# Patient Record
Sex: Male | Born: 1945
Health system: Southern US, Community
[De-identification: ages and names within clinical notes are randomized; demographics above are authoritative.]

## PROBLEM LIST (undated history)

## (undated) DIAGNOSIS — R55 Syncope and collapse: Secondary | ICD-10-CM

## (undated) DIAGNOSIS — I119 Hypertensive heart disease without heart failure: Secondary | ICD-10-CM

## (undated) DIAGNOSIS — F329 Major depressive disorder, single episode, unspecified: Secondary | ICD-10-CM

## (undated) DIAGNOSIS — F419 Anxiety disorder, unspecified: Secondary | ICD-10-CM

## (undated) DIAGNOSIS — L309 Dermatitis, unspecified: Secondary | ICD-10-CM

## (undated) DIAGNOSIS — N400 Enlarged prostate without lower urinary tract symptoms: Secondary | ICD-10-CM

## (undated) DIAGNOSIS — E785 Hyperlipidemia, unspecified: Secondary | ICD-10-CM

## (undated) DIAGNOSIS — E119 Type 2 diabetes mellitus without complications: Secondary | ICD-10-CM

## (undated) DIAGNOSIS — F32A Depression, unspecified: Secondary | ICD-10-CM

## (undated) HISTORY — PX: HERNIA REPAIR: SHX51

## (undated) HISTORY — PX: COLON SURGERY: SHX602

## (undated) HISTORY — DX: Hypertensive heart disease without heart failure: I11.9

## (undated) HISTORY — DX: Syncope and collapse: R55

## (undated) HISTORY — DX: Anxiety disorder, unspecified: F41.9

## (undated) HISTORY — DX: Dermatitis, unspecified: L30.9

## (undated) HISTORY — DX: Type 2 diabetes mellitus without complications: E11.9

## (undated) HISTORY — DX: Benign prostatic hyperplasia without lower urinary tract symptoms: N40.0

## (undated) HISTORY — PX: SHOULDER SURGERY: SHX246

## (undated) HISTORY — DX: Hyperlipidemia, unspecified: E78.5

## (undated) HISTORY — DX: Depression, unspecified: F32.A

---
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
## 2014-07-02 DIAGNOSIS — M542 Cervicalgia: Secondary | ICD-10-CM | POA: Insufficient documentation

## 2014-07-02 DIAGNOSIS — Y92009 Unspecified place in unspecified non-institutional (private) residence as the place of occurrence of the external cause: Secondary | ICD-10-CM

## 2014-07-02 DIAGNOSIS — R55 Syncope and collapse: Secondary | ICD-10-CM | POA: Insufficient documentation

## 2014-07-02 DIAGNOSIS — W19XXXA Unspecified fall, initial encounter: Secondary | ICD-10-CM | POA: Insufficient documentation

## 2015-07-23 DIAGNOSIS — M26609 Unspecified temporomandibular joint disorder, unspecified side: Secondary | ICD-10-CM | POA: Diagnosis not present

## 2015-08-19 ENCOUNTER — Ambulatory Visit (INDEPENDENT_AMBULATORY_CARE_PROVIDER_SITE_OTHER): Payer: PPO | Admitting: Sports Medicine

## 2015-08-19 ENCOUNTER — Encounter: Payer: Self-pay | Admitting: Sports Medicine

## 2015-08-19 DIAGNOSIS — F17200 Nicotine dependence, unspecified, uncomplicated: Secondary | ICD-10-CM

## 2015-08-19 DIAGNOSIS — E119 Type 2 diabetes mellitus without complications: Secondary | ICD-10-CM

## 2015-08-19 DIAGNOSIS — M79672 Pain in left foot: Secondary | ICD-10-CM | POA: Diagnosis not present

## 2015-08-19 DIAGNOSIS — Z720 Tobacco use: Secondary | ICD-10-CM | POA: Diagnosis not present

## 2015-08-19 DIAGNOSIS — Q828 Other specified congenital malformations of skin: Secondary | ICD-10-CM | POA: Diagnosis not present

## 2015-08-19 NOTE — Patient Instructions (Signed)
Diabetes and Foot Care Diabetes may cause you to have problems because of poor blood supply (circulation) to your feet and legs. This may cause the skin on your feet to become thinner, break easier, and heal more slowly. Your skin may become dry, and the skin may peel and crack. You may also have nerve damage in your legs and feet causing decreased feeling in them. You may not notice minor injuries to your feet that could lead to infections or more serious problems. Taking care of your feet is one of the most important things you can do for yourself.  HOME CARE INSTRUCTIONS  Wear shoes at all times, even in the house. Do not go barefoot. Bare feet are easily injured.  Check your feet daily for blisters, cuts, and redness. If you cannot see the bottom of your feet, use a mirror or ask someone for help.  Wash your feet with warm water (do not use hot water) and mild soap. Then pat your feet and the areas between your toes until they are completely dry. Do not soak your feet as this can dry your skin.  Apply a moisturizing lotion or petroleum jelly (that does not contain alcohol and is unscented) to the skin on your feet and to dry, brittle toenails. Do not apply lotion between your toes.  Trim your toenails straight across. Do not dig under them or around the cuticle. File the edges of your nails with an emery board or nail file.  Do not cut corns or calluses or try to remove them with medicine.  Wear clean socks or stockings every day. Make sure they are not too tight. Do not wear knee-high stockings since they may decrease blood flow to your legs.  Wear shoes that fit properly and have enough cushioning. To break in new shoes, wear them for just a few hours a day. This prevents you from injuring your feet. Always look in your shoes before you put them on to be sure there are no objects inside.  Do not cross your legs. This may decrease the blood flow to your feet.  If you find a minor scrape,  cut, or break in the skin on your feet, keep it and the skin around it clean and dry. These areas may be cleansed with mild soap and water. Do not cleanse the area with peroxide, alcohol, or iodine.  When you remove an adhesive bandage, be sure not to damage the skin around it.  If you have a wound, look at it several times a day to make sure it is healing.  Do not use heating pads or hot water bottles. They may burn your skin. If you have lost feeling in your feet or legs, you may not know it is happening until it is too late.  Make sure your health care provider performs a complete foot exam at least annually or more often if you have foot problems. Report any cuts, sores, or bruises to your health care provider immediately. SEEK MEDICAL CARE IF:   You have an injury that is not healing.  You have cuts or breaks in the skin.  You have an ingrown nail.  You notice redness on your legs or feet.  You feel burning or tingling in your legs or feet.  You have pain or cramps in your legs and feet.  Your legs or feet are numb.  Your feet always feel cold. SEEK IMMEDIATE MEDICAL CARE IF:   There is increasing redness,   swelling, or pain in or around a wound.  There is a red line that goes up your leg.  Pus is coming from a wound.  You develop a fever or as directed by your health care provider.  You notice a bad smell coming from an ulcer or wound.   This information is not intended to replace advice given to you by your health care provider. Make sure you discuss any questions you have with your health care provider.   Document Released: 05/19/2000 Document Revised: 01/22/2013 Document Reviewed: 10/29/2012 Elsevier Interactive Patient Education 2016 Elsevier Inc.  

## 2015-08-19 NOTE — Progress Notes (Signed)
Patient ID: Dominic Barber, male   DOB: [birth year], 70 y.o.   MRN: [id number] Subjective: Dominic Barber is a 70 y.o. diabetic male patient who presents to office for evaluation of Left foot pain. Patient complains of pain at the lesion present on Left foot at the ball that has been slowly getting thicker over the last year. Patient states that he was given orthotics from [place] which helped a little bit. However, when the skin builds up very thick nothing helps; pain with direct pressure. Denies stepping on anything any trauma or injury. Patient denies any other pedal complaints.   FBS 180 today   Patient Active Problem List   Diagnosis Date Noted  . Cervical pain 07/02/2014  . Fall in home 07/02/2014  . Syncope and collapse 07/02/2014    No current outpatient prescriptions on file prior to visit.   No current facility-administered medications on file prior to visit.    Allergies  Allergen Reactions  . Meperidine Nausea And Vomiting    Objective:  General: Alert and oriented x3 in no acute distress  Dermatology: Keratotic lesion present sub met 1 on left with central nucleated core noted, no webspace macerations, no ecchymosis bilateral, all nails x 10 are well manicured.  Vascular: Dorsalis Pedis and Posterior Tibial pedal pulses 2/4, Capillary Fill Time 3 seconds, + pedal hair growth bilateral, no edema bilateral lower extremities, Temperature gradient within normal limits.  Neurology: Gross sensation intact via light touch bilateral. Protective sensation intact with Semmes Weinstein monofilament. Vibratory intact with tuning fork bilateral.   Musculoskeletal: Moderate tenderness with palpation at the lesion site on Left, Muscular strength 5/5 in all groups without pain or limitation on range of motion. Pes cavus foot type bilateral.  Assessment and Plan: Problem List Items Addressed This Visit    None    Visit Diagnoses    Porokeratosis    -  Primary    Left foot pain         Diabetes mellitus without complication (HCC)        Relevant Medications    glipiZIDE (GLUCOTROL) 10 MG tablet    lisinopril (PRINIVIL,ZESTRIL) 40 MG tablet    metFORMIN (GLUCOPHAGE) 1000 MG tablet    atorvastatin (LIPITOR) 40 MG tablet    Current smoker           -Complete examination performed -Discussed treatment options -Parred keratoic lesion using a chisel blade; treated the area with Salinocaine and a offloading pad. Instructed patient on use -Recommend to continue with good supportive diabetic shoes and inserts -Recommend daily skin emollients to prevent callus skin from building up -Encourage daily Inspection of feet in the setting of diabetes and continued care -Patient to return to office as needed or [facility] or sooner if condition worsens.  Dominic Barber, DPM

## 2015-08-20 DIAGNOSIS — N400 Enlarged prostate without lower urinary tract symptoms: Secondary | ICD-10-CM | POA: Diagnosis not present

## 2015-08-20 DIAGNOSIS — R350 Frequency of micturition: Secondary | ICD-10-CM | POA: Diagnosis not present

## 2015-08-20 DIAGNOSIS — E119 Type 2 diabetes mellitus without complications: Secondary | ICD-10-CM | POA: Diagnosis not present

## 2015-09-23 DIAGNOSIS — J209 Acute bronchitis, unspecified: Secondary | ICD-10-CM | POA: Diagnosis not present

## 2015-10-11 DIAGNOSIS — E119 Type 2 diabetes mellitus without complications: Secondary | ICD-10-CM | POA: Diagnosis not present

## 2015-10-11 DIAGNOSIS — F330 Major depressive disorder, recurrent, mild: Secondary | ICD-10-CM | POA: Diagnosis not present

## 2015-10-11 DIAGNOSIS — Z0000 Encounter for general adult medical examination without abnormal findings: Secondary | ICD-10-CM | POA: Diagnosis not present

## 2015-10-11 DIAGNOSIS — I10 Essential (primary) hypertension: Secondary | ICD-10-CM | POA: Diagnosis not present

## 2015-10-11 DIAGNOSIS — E7800 Pure hypercholesterolemia, unspecified: Secondary | ICD-10-CM | POA: Diagnosis not present

## 2015-10-11 DIAGNOSIS — Z1389 Encounter for screening for other disorder: Secondary | ICD-10-CM | POA: Diagnosis not present

## 2015-10-11 DIAGNOSIS — N400 Enlarged prostate without lower urinary tract symptoms: Secondary | ICD-10-CM | POA: Diagnosis not present

## 2015-10-11 DIAGNOSIS — E559 Vitamin D deficiency, unspecified: Secondary | ICD-10-CM | POA: Diagnosis not present

## 2016-01-12 DIAGNOSIS — R350 Frequency of micturition: Secondary | ICD-10-CM | POA: Diagnosis not present

## 2016-01-12 DIAGNOSIS — Z794 Long term (current) use of insulin: Secondary | ICD-10-CM | POA: Diagnosis not present

## 2016-01-12 DIAGNOSIS — E119 Type 2 diabetes mellitus without complications: Secondary | ICD-10-CM | POA: Diagnosis not present

## 2016-04-11 DIAGNOSIS — L301 Dyshidrosis [pompholyx]: Secondary | ICD-10-CM | POA: Diagnosis not present

## 2016-04-11 DIAGNOSIS — B353 Tinea pedis: Secondary | ICD-10-CM | POA: Diagnosis not present

## 2016-04-14 DIAGNOSIS — E119 Type 2 diabetes mellitus without complications: Secondary | ICD-10-CM | POA: Diagnosis not present

## 2016-04-14 DIAGNOSIS — E7800 Pure hypercholesterolemia, unspecified: Secondary | ICD-10-CM | POA: Diagnosis not present

## 2016-04-14 DIAGNOSIS — I10 Essential (primary) hypertension: Secondary | ICD-10-CM | POA: Diagnosis not present

## 2016-07-19 DIAGNOSIS — E119 Type 2 diabetes mellitus without complications: Secondary | ICD-10-CM | POA: Diagnosis not present

## 2016-07-19 DIAGNOSIS — Z720 Tobacco use: Secondary | ICD-10-CM | POA: Diagnosis not present

## 2016-07-19 DIAGNOSIS — Z87891 Personal history of nicotine dependence: Secondary | ICD-10-CM | POA: Diagnosis not present

## 2016-07-19 DIAGNOSIS — E7800 Pure hypercholesterolemia, unspecified: Secondary | ICD-10-CM | POA: Diagnosis not present

## 2016-07-19 DIAGNOSIS — I10 Essential (primary) hypertension: Secondary | ICD-10-CM | POA: Diagnosis not present

## 2016-12-22 DIAGNOSIS — R42 Dizziness and giddiness: Secondary | ICD-10-CM | POA: Diagnosis not present

## 2016-12-22 DIAGNOSIS — E119 Type 2 diabetes mellitus without complications: Secondary | ICD-10-CM | POA: Diagnosis not present

## 2016-12-22 DIAGNOSIS — I10 Essential (primary) hypertension: Secondary | ICD-10-CM | POA: Diagnosis not present

## 2017-02-06 DIAGNOSIS — E162 Hypoglycemia, unspecified: Secondary | ICD-10-CM | POA: Diagnosis not present

## 2017-02-06 DIAGNOSIS — H6120 Impacted cerumen, unspecified ear: Secondary | ICD-10-CM | POA: Diagnosis not present

## 2017-02-06 DIAGNOSIS — R42 Dizziness and giddiness: Secondary | ICD-10-CM | POA: Diagnosis not present

## 2017-03-01 DIAGNOSIS — Z23 Encounter for immunization: Secondary | ICD-10-CM | POA: Diagnosis not present

## 2017-03-26 DIAGNOSIS — Z87891 Personal history of nicotine dependence: Secondary | ICD-10-CM | POA: Diagnosis not present

## 2017-03-26 DIAGNOSIS — E7800 Pure hypercholesterolemia, unspecified: Secondary | ICD-10-CM | POA: Diagnosis not present

## 2017-03-26 DIAGNOSIS — Z1389 Encounter for screening for other disorder: Secondary | ICD-10-CM | POA: Diagnosis not present

## 2017-03-26 DIAGNOSIS — E119 Type 2 diabetes mellitus without complications: Secondary | ICD-10-CM | POA: Diagnosis not present

## 2017-03-26 DIAGNOSIS — Z0000 Encounter for general adult medical examination without abnormal findings: Secondary | ICD-10-CM | POA: Diagnosis not present

## 2017-03-26 DIAGNOSIS — I10 Essential (primary) hypertension: Secondary | ICD-10-CM | POA: Diagnosis not present

## 2017-03-26 DIAGNOSIS — Z720 Tobacco use: Secondary | ICD-10-CM | POA: Diagnosis not present

## 2017-03-26 DIAGNOSIS — N400 Enlarged prostate without lower urinary tract symptoms: Secondary | ICD-10-CM | POA: Diagnosis not present

## 2017-06-08 DIAGNOSIS — E119 Type 2 diabetes mellitus without complications: Secondary | ICD-10-CM | POA: Diagnosis not present

## 2017-06-22 DIAGNOSIS — R351 Nocturia: Secondary | ICD-10-CM | POA: Diagnosis not present

## 2017-06-22 DIAGNOSIS — N3000 Acute cystitis without hematuria: Secondary | ICD-10-CM | POA: Diagnosis not present

## 2017-06-22 DIAGNOSIS — N318 Other neuromuscular dysfunction of bladder: Secondary | ICD-10-CM | POA: Diagnosis not present

## 2017-06-22 DIAGNOSIS — N401 Enlarged prostate with lower urinary tract symptoms: Secondary | ICD-10-CM | POA: Diagnosis not present

## 2017-06-28 DIAGNOSIS — N401 Enlarged prostate with lower urinary tract symptoms: Secondary | ICD-10-CM | POA: Diagnosis not present

## 2017-06-28 DIAGNOSIS — R351 Nocturia: Secondary | ICD-10-CM | POA: Diagnosis not present

## 2017-06-28 DIAGNOSIS — N3090 Cystitis, unspecified without hematuria: Secondary | ICD-10-CM | POA: Diagnosis not present

## 2017-06-29 DIAGNOSIS — N401 Enlarged prostate with lower urinary tract symptoms: Secondary | ICD-10-CM | POA: Diagnosis not present

## 2017-06-29 DIAGNOSIS — N318 Other neuromuscular dysfunction of bladder: Secondary | ICD-10-CM | POA: Diagnosis not present

## 2017-07-02 DIAGNOSIS — N401 Enlarged prostate with lower urinary tract symptoms: Secondary | ICD-10-CM | POA: Diagnosis not present

## 2017-07-02 DIAGNOSIS — N3020 Other chronic cystitis without hematuria: Secondary | ICD-10-CM | POA: Diagnosis not present

## 2017-07-02 DIAGNOSIS — R338 Other retention of urine: Secondary | ICD-10-CM | POA: Diagnosis not present

## 2017-07-10 DIAGNOSIS — N3020 Other chronic cystitis without hematuria: Secondary | ICD-10-CM | POA: Diagnosis not present

## 2017-07-10 DIAGNOSIS — N401 Enlarged prostate with lower urinary tract symptoms: Secondary | ICD-10-CM | POA: Diagnosis not present

## 2017-07-10 DIAGNOSIS — N318 Other neuromuscular dysfunction of bladder: Secondary | ICD-10-CM | POA: Diagnosis not present

## 2017-08-07 DIAGNOSIS — N3020 Other chronic cystitis without hematuria: Secondary | ICD-10-CM | POA: Diagnosis not present

## 2017-08-07 DIAGNOSIS — N318 Other neuromuscular dysfunction of bladder: Secondary | ICD-10-CM | POA: Diagnosis not present

## 2017-08-07 DIAGNOSIS — N401 Enlarged prostate with lower urinary tract symptoms: Secondary | ICD-10-CM | POA: Diagnosis not present

## 2017-09-10 DIAGNOSIS — E119 Type 2 diabetes mellitus without complications: Secondary | ICD-10-CM | POA: Diagnosis not present

## 2018-03-20 DIAGNOSIS — Z23 Encounter for immunization: Secondary | ICD-10-CM | POA: Diagnosis not present

## 2018-07-08 DIAGNOSIS — H6123 Impacted cerumen, bilateral: Secondary | ICD-10-CM | POA: Diagnosis not present

## 2018-07-17 DIAGNOSIS — K219 Gastro-esophageal reflux disease without esophagitis: Secondary | ICD-10-CM | POA: Diagnosis not present

## 2018-07-17 DIAGNOSIS — R112 Nausea with vomiting, unspecified: Secondary | ICD-10-CM | POA: Diagnosis not present

## 2018-07-29 DIAGNOSIS — R112 Nausea with vomiting, unspecified: Secondary | ICD-10-CM | POA: Diagnosis not present

## 2018-07-29 DIAGNOSIS — R05 Cough: Secondary | ICD-10-CM | POA: Diagnosis not present

## 2018-08-02 DIAGNOSIS — K5900 Constipation, unspecified: Secondary | ICD-10-CM | POA: Diagnosis not present

## 2018-08-02 DIAGNOSIS — N3289 Other specified disorders of bladder: Secondary | ICD-10-CM | POA: Diagnosis not present

## 2018-08-02 DIAGNOSIS — E119 Type 2 diabetes mellitus without complications: Secondary | ICD-10-CM | POA: Diagnosis not present

## 2018-08-02 DIAGNOSIS — J439 Emphysema, unspecified: Secondary | ICD-10-CM | POA: Diagnosis not present

## 2018-08-02 DIAGNOSIS — F17200 Nicotine dependence, unspecified, uncomplicated: Secondary | ICD-10-CM | POA: Diagnosis not present

## 2018-08-02 DIAGNOSIS — K8020 Calculus of gallbladder without cholecystitis without obstruction: Secondary | ICD-10-CM | POA: Diagnosis not present

## 2018-08-02 DIAGNOSIS — E860 Dehydration: Secondary | ICD-10-CM | POA: Diagnosis not present

## 2018-08-05 DIAGNOSIS — R339 Retention of urine, unspecified: Secondary | ICD-10-CM | POA: Diagnosis not present

## 2018-08-05 DIAGNOSIS — N3000 Acute cystitis without hematuria: Secondary | ICD-10-CM | POA: Diagnosis not present

## 2018-08-05 DIAGNOSIS — E871 Hypo-osmolality and hyponatremia: Secondary | ICD-10-CM | POA: Diagnosis not present

## 2018-08-05 DIAGNOSIS — R338 Other retention of urine: Secondary | ICD-10-CM | POA: Diagnosis not present

## 2018-08-05 DIAGNOSIS — N3090 Cystitis, unspecified without hematuria: Secondary | ICD-10-CM | POA: Diagnosis not present

## 2018-08-05 DIAGNOSIS — I719 Aortic aneurysm of unspecified site, without rupture: Secondary | ICD-10-CM | POA: Diagnosis not present

## 2018-08-06 DIAGNOSIS — R338 Other retention of urine: Secondary | ICD-10-CM | POA: Diagnosis not present

## 2018-08-06 DIAGNOSIS — N401 Enlarged prostate with lower urinary tract symptoms: Secondary | ICD-10-CM | POA: Diagnosis not present

## 2018-08-06 DIAGNOSIS — N3000 Acute cystitis without hematuria: Secondary | ICD-10-CM | POA: Diagnosis not present

## 2018-08-07 DIAGNOSIS — I714 Abdominal aortic aneurysm, without rupture: Secondary | ICD-10-CM | POA: Diagnosis not present

## 2018-08-07 DIAGNOSIS — N179 Acute kidney failure, unspecified: Secondary | ICD-10-CM | POA: Diagnosis not present

## 2018-08-07 DIAGNOSIS — R339 Retention of urine, unspecified: Secondary | ICD-10-CM | POA: Diagnosis not present

## 2018-08-13 DIAGNOSIS — R338 Other retention of urine: Secondary | ICD-10-CM | POA: Diagnosis not present

## 2018-08-13 DIAGNOSIS — N401 Enlarged prostate with lower urinary tract symptoms: Secondary | ICD-10-CM | POA: Diagnosis not present

## 2018-08-13 DIAGNOSIS — N3000 Acute cystitis without hematuria: Secondary | ICD-10-CM | POA: Diagnosis not present

## 2018-08-14 DIAGNOSIS — E871 Hypo-osmolality and hyponatremia: Secondary | ICD-10-CM | POA: Diagnosis not present

## 2018-08-23 DIAGNOSIS — N401 Enlarged prostate with lower urinary tract symptoms: Secondary | ICD-10-CM | POA: Diagnosis not present

## 2018-08-23 DIAGNOSIS — N318 Other neuromuscular dysfunction of bladder: Secondary | ICD-10-CM | POA: Diagnosis not present

## 2018-08-23 DIAGNOSIS — N3020 Other chronic cystitis without hematuria: Secondary | ICD-10-CM | POA: Diagnosis not present

## 2018-08-29 DIAGNOSIS — R1310 Dysphagia, unspecified: Secondary | ICD-10-CM | POA: Diagnosis not present

## 2018-09-06 DIAGNOSIS — N3020 Other chronic cystitis without hematuria: Secondary | ICD-10-CM | POA: Diagnosis not present

## 2018-09-06 DIAGNOSIS — R8290 Unspecified abnormal findings in urine: Secondary | ICD-10-CM | POA: Diagnosis not present

## 2018-09-06 DIAGNOSIS — R29810 Facial weakness: Secondary | ICD-10-CM | POA: Diagnosis not present

## 2018-09-06 DIAGNOSIS — Z79899 Other long term (current) drug therapy: Secondary | ICD-10-CM | POA: Diagnosis not present

## 2018-09-06 DIAGNOSIS — R338 Other retention of urine: Secondary | ICD-10-CM | POA: Diagnosis not present

## 2018-09-06 DIAGNOSIS — N401 Enlarged prostate with lower urinary tract symptoms: Secondary | ICD-10-CM | POA: Diagnosis not present

## 2018-10-03 DIAGNOSIS — R351 Nocturia: Secondary | ICD-10-CM | POA: Diagnosis not present

## 2018-10-03 DIAGNOSIS — N401 Enlarged prostate with lower urinary tract symptoms: Secondary | ICD-10-CM | POA: Diagnosis not present

## 2018-10-03 DIAGNOSIS — N3020 Other chronic cystitis without hematuria: Secondary | ICD-10-CM | POA: Diagnosis not present

## 2018-10-03 DIAGNOSIS — R338 Other retention of urine: Secondary | ICD-10-CM | POA: Diagnosis not present

## 2018-12-27 DIAGNOSIS — E7800 Pure hypercholesterolemia, unspecified: Secondary | ICD-10-CM | POA: Diagnosis not present

## 2018-12-27 DIAGNOSIS — E119 Type 2 diabetes mellitus without complications: Secondary | ICD-10-CM | POA: Diagnosis not present

## 2018-12-27 DIAGNOSIS — I10 Essential (primary) hypertension: Secondary | ICD-10-CM | POA: Diagnosis not present

## 2019-01-06 ENCOUNTER — Other Ambulatory Visit: Payer: Self-pay

## 2019-02-12 DIAGNOSIS — Z23 Encounter for immunization: Secondary | ICD-10-CM | POA: Diagnosis not present

## 2019-03-26 DIAGNOSIS — E119 Type 2 diabetes mellitus without complications: Secondary | ICD-10-CM | POA: Diagnosis not present

## 2019-03-26 DIAGNOSIS — J181 Lobar pneumonia, unspecified organism: Secondary | ICD-10-CM | POA: Diagnosis not present

## 2019-03-26 DIAGNOSIS — I110 Hypertensive heart disease with heart failure: Secondary | ICD-10-CM | POA: Diagnosis not present

## 2019-03-26 DIAGNOSIS — R0602 Shortness of breath: Secondary | ICD-10-CM | POA: Diagnosis not present

## 2019-03-26 DIAGNOSIS — R05 Cough: Secondary | ICD-10-CM | POA: Diagnosis not present

## 2019-03-26 DIAGNOSIS — I509 Heart failure, unspecified: Secondary | ICD-10-CM | POA: Diagnosis not present

## 2019-03-26 DIAGNOSIS — F17210 Nicotine dependence, cigarettes, uncomplicated: Secondary | ICD-10-CM | POA: Diagnosis not present

## 2019-03-26 DIAGNOSIS — R079 Chest pain, unspecified: Secondary | ICD-10-CM | POA: Diagnosis not present

## 2019-03-26 DIAGNOSIS — J189 Pneumonia, unspecified organism: Secondary | ICD-10-CM | POA: Diagnosis not present

## 2019-03-26 DIAGNOSIS — R0600 Dyspnea, unspecified: Secondary | ICD-10-CM | POA: Diagnosis not present

## 2019-03-26 DIAGNOSIS — E871 Hypo-osmolality and hyponatremia: Secondary | ICD-10-CM | POA: Diagnosis not present

## 2019-03-26 DIAGNOSIS — I161 Hypertensive emergency: Secondary | ICD-10-CM | POA: Diagnosis not present

## 2019-03-31 DIAGNOSIS — I509 Heart failure, unspecified: Secondary | ICD-10-CM | POA: Diagnosis not present

## 2019-04-01 ENCOUNTER — Other Ambulatory Visit (HOSPITAL_BASED_OUTPATIENT_CLINIC_OR_DEPARTMENT_OTHER): Payer: Self-pay | Admitting: Internal Medicine

## 2019-04-01 ENCOUNTER — Other Ambulatory Visit (HOSPITAL_COMMUNITY): Payer: Self-pay | Admitting: Internal Medicine

## 2019-04-01 DIAGNOSIS — R0602 Shortness of breath: Secondary | ICD-10-CM

## 2019-04-03 ENCOUNTER — Other Ambulatory Visit: Payer: Self-pay

## 2019-04-03 ENCOUNTER — Ambulatory Visit (HOSPITAL_BASED_OUTPATIENT_CLINIC_OR_DEPARTMENT_OTHER)
Admission: RE | Admit: 2019-04-03 | Discharge: 2019-04-03 | Disposition: A | Discharge status: Home / Self Care | Payer: PPO | Source: Ambulatory Visit | Attending: Internal Medicine | Admitting: Internal Medicine

## 2019-04-03 DIAGNOSIS — J449 Chronic obstructive pulmonary disease, unspecified: Secondary | ICD-10-CM | POA: Insufficient documentation

## 2019-04-03 DIAGNOSIS — F17200 Nicotine dependence, unspecified, uncomplicated: Secondary | ICD-10-CM | POA: Insufficient documentation

## 2019-04-03 DIAGNOSIS — R0602 Shortness of breath: Secondary | ICD-10-CM

## 2019-04-03 NOTE — Progress Notes (Signed)
  Echocardiogram 2D Echocardiogram has been performed.  Dominic Barber 04/03/2019, 9:33 AM

## 2019-04-06 ENCOUNTER — Encounter: Payer: Self-pay | Admitting: Cardiology

## 2019-04-06 NOTE — Progress Notes (Signed)
Cardiology Office Note:    Date:  04/08/2019   ID:  [name], DOB 03/10/1946, MRN [id number]  PCP:  Dominic Roger, MD  Cardiologist:  Shirlee More, MD   Referring MD: Dominic Roger, MD  ASSESSMENT:    1. Chronic systolic heart failure ([place])   2. Hypertensive heart disease with heart failure ([place])   3. Hyperlipidemia, unspecified hyperlipidemia type   4. Type 2 diabetes mellitus without complication, unspecified whether long term insulin use (HCC)   5. Chest pain, unspecified type   6. Shortness of breath on exertion   7. Fatigue, unspecified type   8. Dizziness    PLAN:    In order of problems listed above:  1. He continues to be decompensated class II but no fluid overload due to heart failure continue current treatment and initiate cautiously a small amount of selective beta-blocker.  Await visit in 1 week and decision regarding diagnostic left and right heart catheterization 2. Stable BP at target continue ACE inhibitor loop diuretic 3. Continue his high intensity statin 4. Stable continue current treatment long-term he benefit from SGLT2 agent and I will discuss with his primary care physician  Next appointment 1 week   Medication Adjustments/Labs and Tests Ordered: Current medicines are reviewed at length with the patient today.  Concerns regarding medicines are outlined above.  Orders Placed This Encounter  Procedures  . Basic Metabolic Panel (BMET)  . Pro b natriuretic peptide (BNP)  . Troponin I  . CBC  . Protime-INR   Meds ordered this encounter  Medications  . metoprolol succinate (TOPROL-XL) 25 MG 24 hr tablet    Sig: Take 1 tablet (25 mg total) by mouth daily.    Dispense:  30 tablet    Refill:  1     Chief Complaint  Patient presents with  . Congestive Heart Failure     and low EF 35-40%    History of Present Illness:    Dominic Barber is a 73 y.o. male who is being seen today for the evaluation of heart failure at the request of Dominic Roger, MD.  Prior to the visit 04/03/2019 underwent echocardiogram showing ejection fraction 35 to 40% left ventricle with normal right ventricular function.  He had no significant valvular abnormality.  He has been treated for heart failure by his primary care physician prior to referral was described as having anasarca. Review of records in care everywhere she has a background history of hypertension hyperlipidemia type 2 diabetes previous syncope anxiety depression prostatic enlargement bilateral inguinal hernia repair rotator cuff surgery and colon surgery. EKG independently reviewed from his PCP office 04/07/2019 shows sinus rhythm first-degree AV block.  There is ST-T abnormality suggestive of anterior septal infarction age indeterminate and ischemic precordial T wave inversion.  Laboratory test were performed 03/31/2019 showing a creatinine of 1.10 GFR 65 cc/min serum sodium diminished at 125 potassium 4.6.  He relates that 3 weeks ago he became ill and describes an abrupt rapid onset.  He developed severe shortness of breath with any activities even ADLs orthopnea PND and marked edema.  He was started on a loop diuretic as an outpatient has lost 30 pounds orthopnea and PND have resolved he has no edema but still short of breath with more than indoor activities and feels profoundly weak.  Also had hyponatremia at baseline.  His EKG suggest ischemia or recent infarction and echocardiogram shows severe left ventricular dysfunction.  He is seen by me in the  office today and he has had no chest pain or syncope.  He has no known history of heart disease but tells me he had rheumatic fever as a child and does not think he has had a previous EKG performed to his memory.  Likely his heart failure is improved but still New [place] Heart Association class II.  To optimize treatment I started a low-dose of a selective beta-blocker as I suspect he has significant underlying COPD.  I advised him to undergo  diagnostic left and right heart catheterization concerned with diabetes he has unrecognized CAD and may require revascularization for survival benefit.  Initially said yes then he told me like to go home and think can you come back to my office in a week I will draw labs including troponin proBNP and routine precath orders and see him back in my office 1 week or sooner.  I think he is tenuously compensated and I did not decide today to transition him from lisinopril to [facility].  I would like to see his renal function before adding MRA would defer until after coronary angiography of a contrast load.  Past Medical History:  Diagnosis Date  . Anxiety   . BPH (benign prostatic hyperplasia)   . Depression   . Eczema   . Hyperlipidemia   . Hypertensive heart disease   . Syncope   . Type 2 diabetes mellitus (HCC)     Past Surgical History:  Procedure Laterality Date  . COLON SURGERY    . HERNIA REPAIR Bilateral   . SHOULDER SURGERY      Current Medications: Current Meds  Medication Sig  . ALPRAZolam (XANAX) 0.5 MG tablet Take 0.5 mg by mouth daily as needed.   . ARIPiprazole (ABILIFY) 20 MG tablet Take 20 mg by mouth daily.   Marland Kitchen atorvastatin (LIPITOR) 40 MG tablet Take 40 mg by mouth daily.   . furosemide (LASIX) 20 MG tablet Take 20 mg by mouth daily.  Marland Kitchen glipiZIDE (GLUCOTROL) 10 MG tablet Take 10 mg by mouth 2 (two) times daily before a meal.   . HYDROcodone-acetaminophen (NORCO/VICODIN) 5-325 MG tablet Take 1 tablet by mouth every 4 (four) hours as needed.  Marland Kitchen lisinopril (PRINIVIL,ZESTRIL) 40 MG tablet Take 40 mg by mouth daily.   . metFORMIN (GLUCOPHAGE) 1000 MG tablet Take 1,000 mg by mouth 2 (two) times daily.   . pantoprazole (PROTONIX) 40 MG tablet Take 40 mg by mouth daily.  . tamsulosin (FLOMAX) 0.4 MG CAPS capsule Take 0.4 mg by mouth daily.   Marland Kitchen venlafaxine XR (EFFEXOR XR) 75 MG 24 hr capsule Take 75 mg by mouth daily.      Allergies:   Meperidine   Social History    Socioeconomic History  . Marital status: Married    Spouse name: Not on file  . Number of children: Not on file  . Years of education: Not on file  . Highest education level: Not on file  Occupational History  . Not on file  Social Needs  . Financial resource strain: Not on file  . Food insecurity    Worry: Not on file    Inability: Not on file  . Transportation needs    Medical: Not on file    Non-medical: Not on file  Tobacco Use  . Smoking status: Current Every Day Smoker    Packs/day: 1.00    Years: 50.00    Pack years: 50.00    Types: Cigarettes  . Smokeless tobacco: Never Used  Substance and Sexual Activity  . Alcohol use: Never    Alcohol/week: 0.0 standard drinks    Frequency: Never  . Drug use: Never  . Sexual activity: Not on file  Lifestyle  . Physical activity    Days per week: Not on file    Minutes per session: Not on file  . Stress: Not on file  Relationships  . Social Musicianconnections    Talks on phone: Not on file    Gets together: Not on file    Attends religious service: Not on file    Active member of club or organization: Not on file    Attends meetings of clubs or organizations: Not on file    Relationship status: Not on file  Other Topics Concern  . Not on file  Social History Narrative  . Not on file     Family History: The patient's family history is negative for Heart attack, Stroke, and Diabetes.  ROS:   Review of Systems  Constitution: Positive for malaise/fatigue and weight loss.  HENT: Negative.   Eyes: Negative.   Cardiovascular: Positive for dyspnea on exertion, leg swelling, orthopnea and paroxysmal nocturnal dyspnea.  Respiratory: Positive for cough and shortness of breath.   Endocrine: Negative.   Hematologic/Lymphatic: Negative.   Skin: Negative.   Musculoskeletal: Positive for muscle weakness.  Gastrointestinal: Negative.   Genitourinary: Negative.   Neurological: Negative.   Psychiatric/Behavioral: Negative.    Allergic/Immunologic: Negative.    Please see the history of present illness.   Had a 30 pound weight loss with diuretic therapy  All other systems reviewed and are negative.  EKGs/Labs/Other Studies Reviewed:    The following studies were reviewed today:   Physical Exam:    VS:  BP (!) 152/70 (BP Location: Left Arm, Patient Position: Sitting, Cuff Size: Normal)   Pulse 83   Ht 5\' 10"  (1.778 m)   Wt 143 lb 9.6 oz (65.1 kg)   SpO2 99%   BMI 20.60 kg/m     Wt Readings from Last 3 Encounters:  04/08/19 143 lb 9.6 oz (65.1 kg)     GEN: Looks very chronically ill debilitated malnourished  in no acute distress HEENT: Normal NECK: No JVD; No carotid bruits LYMPHATICS: No lymphadenopathy CARDIAC: Soft S1 S3 present in the apex RRR, no murmurs, rubs RESPIRATORY:  Clear to auscultation without rales, wheezing or rhonchi  ABDOMEN: Soft, non-tender, non-distended MUSCULOSKELETAL:  No edema; No deformity  SKIN: Warm and dry NEUROLOGIC:  Alert and oriented x 3 PSYCHIATRIC:  Normal affect     Signed, Norman HerrlichBrian Modesto Ganoe, MD  04/08/2019 12:05 PM    [facility]

## 2019-04-07 DIAGNOSIS — R0602 Shortness of breath: Secondary | ICD-10-CM | POA: Diagnosis not present

## 2019-04-07 DIAGNOSIS — I5021 Acute systolic (congestive) heart failure: Secondary | ICD-10-CM | POA: Diagnosis not present

## 2019-04-08 ENCOUNTER — Other Ambulatory Visit: Payer: Self-pay

## 2019-04-08 ENCOUNTER — Encounter: Payer: Self-pay | Admitting: Cardiology

## 2019-04-08 ENCOUNTER — Ambulatory Visit: Payer: PPO | Admitting: Cardiology

## 2019-04-08 VITALS — BP 152/70 | HR 83 | Ht 70.0 in | Wt 143.6 lb

## 2019-04-08 DIAGNOSIS — E119 Type 2 diabetes mellitus without complications: Secondary | ICD-10-CM | POA: Diagnosis not present

## 2019-04-08 DIAGNOSIS — R0602 Shortness of breath: Secondary | ICD-10-CM | POA: Diagnosis not present

## 2019-04-08 DIAGNOSIS — R5383 Other fatigue: Secondary | ICD-10-CM

## 2019-04-08 DIAGNOSIS — R079 Chest pain, unspecified: Secondary | ICD-10-CM | POA: Diagnosis not present

## 2019-04-08 DIAGNOSIS — I110 Hypertensive heart disease with heart failure: Secondary | ICD-10-CM | POA: Diagnosis not present

## 2019-04-08 DIAGNOSIS — E785 Hyperlipidemia, unspecified: Secondary | ICD-10-CM

## 2019-04-08 DIAGNOSIS — I5022 Chronic systolic (congestive) heart failure: Secondary | ICD-10-CM

## 2019-04-08 DIAGNOSIS — R42 Dizziness and giddiness: Secondary | ICD-10-CM | POA: Diagnosis not present

## 2019-04-08 DIAGNOSIS — R55 Syncope and collapse: Secondary | ICD-10-CM | POA: Diagnosis not present

## 2019-04-08 MED ORDER — METOPROLOL SUCCINATE ER 25 MG PO TB24: 25.0000 mg | ORAL_TABLET | Freq: Every day | ORAL | 1 refills | Status: DC

## 2019-04-08 NOTE — Patient Instructions (Addendum)
Medication Instructions:  Your physician has recommended you make the following change in your medication:  START metoprolol succinate (toprol-XL) 25 mg: Take 1 tablet daily  *If you need a refill on your cardiac medications before your next appointment, please call your pharmacy*  Lab Work: Your physician recommends that you return for lab work today: CBC, BMP, ProBNP, Troponin I, PT/INR.   If you have labs (blood work) drawn today and your tests are completely normal, you will receive your results only by: Marland Kitchen MyChart Message (if you have MyChart) OR . A paper copy in the mail If you have any lab test that is abnormal or we need to change your treatment, we will call you to review the results.  Testing/Procedures: None  Follow-Up: At [facility], you and your health needs are our priority.  As part of our continuing mission to provide you with exceptional heart care, we have created designated Provider Care Teams.  These Care Teams include your primary Cardiologist (physician) and Advanced Practice Providers (APPs -  Physician Assistants and Nurse Practitioners) who all work together to provide you with the care you need, when you need it.  Your next appointment:   4 weeks  The format for your next appointment:   In Person  Provider:   Shirlee More, MD    Metoprolol extended-release tablets What is this medicine? METOPROLOL (me TOE proe lole) is a beta-blocker. Beta-blockers reduce the workload on the heart and help it to beat more regularly. This medicine is used to treat high blood pressure and to prevent chest pain. It is also used to after a heart attack and to prevent an additional heart attack from occurring. This medicine may be used for other purposes; ask your health care provider or pharmacist if you have questions. COMMON BRAND NAME(S): toprol, Toprol XL What should I tell my health care provider before I take this medicine? They need to know if you have any of  these conditions:  diabetes  heart or vessel disease like slow heart rate, worsening heart failure, heart block, sick sinus syndrome or Raynaud's disease  kidney disease  liver disease  lung or breathing disease, like asthma or emphysema  pheochromocytoma  thyroid disease  an unusual or allergic reaction to metoprolol, other beta-blockers, medicines, foods, dyes, or preservatives  pregnant or trying to get pregnant  breast-feeding How should I use this medicine? Take this medicine by mouth with a glass of water. Follow the directions on the prescription label. Do not crush or chew. Take this medicine with or immediately after meals. Take your doses at regular intervals. Do not take more medicine than directed. Do not stop taking this medicine suddenly. This could lead to serious heart-related effects. Talk to your pediatrician regarding the use of this medicine in children. While this drug may be prescribed for children as young as 6 years for selected conditions, precautions do apply. Overdosage: If you think you have taken too much of this medicine contact a poison control center or emergency room at once. NOTE: This medicine is only for you. Do not share this medicine with others. What if I miss a dose? If you miss a dose, take it as soon as you can. If it is almost time for your next dose, take only that dose. Do not take double or extra doses. What may interact with this medicine? This medicine may interact with the following medications:  certain medicines for blood pressure, heart disease, irregular heart beat  certain  medicines for depression, like monoamine oxidase (MAO) inhibitors, fluoxetine, or paroxetine  clonidine  dobutamine  epinephrine  isoproterenol  reserpine This list may not describe all possible interactions. Give your health care provider a list of all the medicines, herbs, non-prescription drugs, or dietary supplements you use. Also tell them if you  smoke, drink alcohol, or use illegal drugs. Some items may interact with your medicine. What should I watch for while using this medicine? Visit your doctor or health care professional for regular check ups. Contact your doctor right away if your symptoms worsen. Check your blood pressure and pulse rate regularly. Ask your health care professional what your blood pressure and pulse rate should be, and when you should contact them. You may get drowsy or dizzy. Do not drive, use machinery, or do anything that needs mental alertness until you know how this medicine affects you. Do not sit or stand up quickly, especially if you are an older patient. This reduces the risk of dizzy or fainting spells. Contact your doctor if these symptoms continue. Alcohol may interfere with the effect of this medicine. Avoid alcoholic drinks. This medicine may increase blood sugar. Ask your healthcare provider if changes in diet or medicines are needed if you have diabetes. What side effects may I notice from receiving this medicine? Side effects that you should report to your doctor or health care professional as soon as possible:  allergic reactions like skin rash, itching or hives  cold or numb hands or feet  depression  difficulty breathing  faint  fever with sore throat  irregular heartbeat, chest pain  rapid weight gain   signs and symptoms of high blood sugar such as being more thirsty or hungry or having to urinate more than normal. You may also feel very tired or have blurry vision.  swollen legs or ankles Side effects that usually do not require medical attention (report to your doctor or health care professional if they continue or are bothersome):  anxiety or nervousness  change in sex drive or performance  dry skin  headache  nightmares or trouble sleeping  short term memory loss  stomach upset or diarrhea This list may not describe all possible side effects. Call your doctor for  medical advice about side effects. You may report side effects to FDA at 1-800-FDA-1088. Where should I keep my medicine? Keep out of the reach of children. Store at room temperature between 15 and 30 degrees C (59 and 86 degrees F). Throw away any unused medicine after the expiration date. NOTE: This sheet is a summary. It may not cover all possible information. If you have questions about this medicine, talk to your doctor, pharmacist, or health care provider.  2020 Elsevier/Gold Standard (2018-03-12 11:09:41)  Heart Failure  Weigh yourself every morning when you first wake up and record on a calender or note pad, bring this to your office visits. Using a pill tender can help with taking your medications consistently.  Limit your fluid intake to 2 liters daily  Limit your sodium intake to less than 2-3 grams daily. Ask if you need dietary teaching.  If you gain more than 3 pounds (from your dry weight ), double your dose of diuretic for the day.  If you gain more than 5 pounds (from your dry weight), double your dose of lasix and call your heart failure doctor.  Please do not smoke tobacco since it is very bad for your heart.  Please do not drink alcohol since  it can worsen your heart failure.Also avoid OTC nonsteroidal drugs, such as advil, aleve and motrin.  Try to exercise for at least 30 minutes every day because this will help your heart be more efficient. You may be eligible for supervised cardiac rehab, ask your physician.

## 2019-04-09 ENCOUNTER — Telehealth: Payer: Self-pay | Admitting: *Deleted

## 2019-04-09 ENCOUNTER — Telehealth (HOSPITAL_BASED_OUTPATIENT_CLINIC_OR_DEPARTMENT_OTHER): Payer: Self-pay | Admitting: Emergency Medicine

## 2019-04-09 ENCOUNTER — Emergency Department (HOSPITAL_BASED_OUTPATIENT_CLINIC_OR_DEPARTMENT_OTHER): Payer: PPO

## 2019-04-09 ENCOUNTER — Emergency Department (HOSPITAL_BASED_OUTPATIENT_CLINIC_OR_DEPARTMENT_OTHER)
Admission: EM | Admit: 2019-04-09 | Discharge: 2019-04-09 | Discharge status: Against Medical Advice | Payer: PPO | Attending: Emergency Medicine | Admitting: Emergency Medicine

## 2019-04-09 ENCOUNTER — Encounter (HOSPITAL_BASED_OUTPATIENT_CLINIC_OR_DEPARTMENT_OTHER): Payer: Self-pay

## 2019-04-09 ENCOUNTER — Other Ambulatory Visit: Payer: Self-pay

## 2019-04-09 DIAGNOSIS — E119 Type 2 diabetes mellitus without complications: Secondary | ICD-10-CM | POA: Insufficient documentation

## 2019-04-09 DIAGNOSIS — I110 Hypertensive heart disease with heart failure: Secondary | ICD-10-CM | POA: Diagnosis not present

## 2019-04-09 DIAGNOSIS — I509 Heart failure, unspecified: Secondary | ICD-10-CM

## 2019-04-09 DIAGNOSIS — F17210 Nicotine dependence, cigarettes, uncomplicated: Secondary | ICD-10-CM | POA: Insufficient documentation

## 2019-04-09 DIAGNOSIS — R0602 Shortness of breath: Secondary | ICD-10-CM | POA: Diagnosis not present

## 2019-04-09 DIAGNOSIS — E871 Hypo-osmolality and hyponatremia: Secondary | ICD-10-CM | POA: Insufficient documentation

## 2019-04-09 DIAGNOSIS — R911 Solitary pulmonary nodule: Secondary | ICD-10-CM | POA: Diagnosis not present

## 2019-04-09 DIAGNOSIS — Z79899 Other long term (current) drug therapy: Secondary | ICD-10-CM | POA: Insufficient documentation

## 2019-04-09 DIAGNOSIS — R0600 Dyspnea, unspecified: Secondary | ICD-10-CM

## 2019-04-09 DIAGNOSIS — Z7984 Long term (current) use of oral hypoglycemic drugs: Secondary | ICD-10-CM | POA: Diagnosis not present

## 2019-04-09 LAB — CBC WITH DIFFERENTIAL/PLATELET
Abs Immature Granulocytes: 0.02 10*3/uL (ref 0.00–0.07)
Basophils Absolute: 0.1 10*3/uL (ref 0.0–0.1)
Basophils Relative: 1 %
Eosinophils Absolute: 0.1 10*3/uL (ref 0.0–0.5)
Eosinophils Relative: 1 %
HCT: 34.3 % — ABNORMAL LOW (ref 39.0–52.0)
Hemoglobin: 10.9 g/dL — ABNORMAL LOW (ref 13.0–17.0)
Immature Granulocytes: 0 %
Lymphocytes Relative: 23 %
Lymphs Abs: 1.8 10*3/uL (ref 0.7–4.0)
MCH: 25.1 pg — ABNORMAL LOW (ref 26.0–34.0)
MCHC: 31.8 g/dL (ref 30.0–36.0)
MCV: 79 fL — ABNORMAL LOW (ref 80.0–100.0)
Monocytes Absolute: 0.8 10*3/uL (ref 0.1–1.0)
Monocytes Relative: 10 %
Neutro Abs: 5 10*3/uL (ref 1.7–7.7)
Neutrophils Relative %: 65 %
Platelets: 372 10*3/uL (ref 150–400)
RBC: 4.34 MIL/uL (ref 4.22–5.81)
RDW: 16.4 % — ABNORMAL HIGH (ref 11.5–15.5)
WBC: 7.7 10*3/uL (ref 4.0–10.5)
nRBC: 0 % (ref 0.0–0.2)

## 2019-04-09 LAB — COMPREHENSIVE METABOLIC PANEL
ALT: 12 U/L (ref 0–44)
AST: 21 U/L (ref 15–41)
Albumin: 3.6 g/dL (ref 3.5–5.0)
Alkaline Phosphatase: 94 U/L (ref 38–126)
Anion gap: 9 (ref 5–15)
BUN: 19 mg/dL (ref 8–23)
CO2: 23 mmol/L (ref 22–32)
Calcium: 8.7 mg/dL — ABNORMAL LOW (ref 8.9–10.3)
Chloride: 92 mmol/L — ABNORMAL LOW (ref 98–111)
Creatinine, Ser: 1.08 mg/dL (ref 0.61–1.24)
GFR calc Af Amer: >60 mL/min (ref >60)
GFR calc non Af Amer: >60 mL/min (ref >60)
Glucose, Bld: 157 mg/dL — ABNORMAL HIGH (ref 70–99)
Potassium: 4.4 mmol/L (ref 3.5–5.1)
Sodium: 124 mmol/L — ABNORMAL LOW (ref 135–145)
Total Bilirubin: 0.5 mg/dL (ref 0.3–1.2)
Total Protein: 7.1 g/dL (ref 6.5–8.1)

## 2019-04-09 LAB — CBC
Hematocrit: 36.9 % — ABNORMAL LOW (ref 37.5–51.0)
Hemoglobin: 11.7 g/dL — ABNORMAL LOW (ref 13.0–17.7)
MCH: 24.9 pg — ABNORMAL LOW (ref 26.6–33.0)
MCHC: 31.7 g/dL (ref 31.5–35.7)
MCV: 79 fL (ref 79–97)
Platelets: 410 10*3/uL (ref 150–450)
RBC: 4.7 x10E6/uL (ref 4.14–5.80)
RDW: 14.9 % (ref 11.6–15.4)
WBC: 8.8 10*3/uL (ref 3.4–10.8)

## 2019-04-09 LAB — BRAIN NATRIURETIC PEPTIDE: B Natriuretic Peptide: 1945.7 pg/mL — ABNORMAL HIGH (ref 0.0–100.0)

## 2019-04-09 LAB — PRO B NATRIURETIC PEPTIDE: NT-Pro BNP: 31084 pg/mL — ABNORMAL HIGH (ref 0–486)

## 2019-04-09 LAB — TROPONIN I (HIGH SENSITIVITY)
Troponin I (High Sensitivity): 16 ng/L (ref <18)
Troponin I (High Sensitivity): 13 ng/L (ref <18)

## 2019-04-09 LAB — BASIC METABOLIC PANEL
BUN/Creatinine Ratio: 16 (ref 10–24)
BUN: 17 mg/dL (ref 8–27)
CO2: 24 mmol/L (ref 20–29)
Calcium: 9.5 mg/dL (ref 8.6–10.2)
Chloride: 88 mmol/L — ABNORMAL LOW (ref 96–106)
Creatinine, Ser: 1.09 mg/dL (ref 0.76–1.27)
GFR calc Af Amer: 76 mL/min/{1.73_m2} (ref >59)
GFR calc non Af Amer: 66 mL/min/{1.73_m2} (ref >59)
Glucose: 90 mg/dL (ref 65–99)
Potassium: 5 mmol/L (ref 3.5–5.2)
Sodium: 128 mmol/L — ABNORMAL LOW (ref 134–144)

## 2019-04-09 LAB — PROTIME-INR
INR: 1 (ref 0.9–1.2)
Prothrombin Time: 11.1 s (ref 9.1–12.0)

## 2019-04-09 LAB — TROPONIN I: Troponin I: 0.05 ng/mL (ref 0.00–0.04)

## 2019-04-09 MED ORDER — VALSARTAN 80 MG PO TABS: 80.0000 mg | ORAL_TABLET | Freq: Two times a day (BID) | ORAL | 0 refills | Status: DC

## 2019-04-09 MED ORDER — NITROGLYCERIN 0.4 MG SL SUBL: 0.4000 mg | SUBLINGUAL_TABLET | SUBLINGUAL | 0 refills | Status: AC | PRN

## 2019-04-09 NOTE — ED Provider Notes (Signed)
[facility] EMERGENCY DEPARTMENT Provider Note   CSN: [id number] Arrival date & time: 04/09/19  1110     History   Chief Complaint Chief Complaint  Patient presents with  . Shortness of Breath    HPI Dominic Barber is a 73 y.o. male with a hx of tobacco abuse, anxiety, depression, hyperlipidemia, CHF last EF 35-40% & T2DM who presents to the ED @ the request of his cardiologist for evaluation of dyspnea w/ abnormal troponin on outpatient labs. Patient states he has been having intermittent dyspnea w/ dry cough x 1 week. Better if he "takes it easy" but no worse w/ exertion, otherwise no alleviating/aggravating factors to sxs. No intervention PTA. Seen by cardiologist Dr. Dulce SellarMunley yesterday and had outpatient labs which have been reviewed, there was concern for troponin of 0.05, recommendation for high sensitivity troponin in the ED. Patient denies fever, chills, chest pain, diaphoresis, nausea/vomiting, weakness, syncope, leg pain/swelling, hemoptysis, recent surgery/trauma, recent long travel, hormone use, personal hx of cancer, or hx of DVT/PE.   HPI  Past Medical History:  Diagnosis Date  . Anxiety   . BPH (benign prostatic hyperplasia)   . Depression   . Eczema   . Hyperlipidemia   . Hypertensive heart disease   . Syncope   . Type 2 diabetes mellitus [facility](HCC)     Patient Active Problem List   Diagnosis Date Noted  . Cervical pain 07/02/2014  . Fall in home 07/02/2014  . Syncope and collapse 07/02/2014    Past Surgical History:  Procedure Laterality Date  . COLON SURGERY    . HERNIA REPAIR Bilateral   . SHOULDER SURGERY          Home Medications    Prior to Admission medications   Medication Sig Start Date End Date Taking? Authorizing Provider  ALPRAZolam Prudy Feeler(XANAX) 0.5 MG tablet Take 0.5 mg by mouth daily as needed.     [provider]  ARIPiprazole (ABILIFY) 20 MG tablet Take 20 mg by mouth daily.     [provider]  atorvastatin  (LIPITOR) 40 MG tablet Take 40 mg by mouth daily.     [provider]  furosemide (LASIX) 20 MG tablet Take 20 mg by mouth daily. 03/31/19   [provider]  glipiZIDE (GLUCOTROL) 10 MG tablet Take 10 mg by mouth 2 (two) times daily before a meal.     [provider]  HYDROcodone-acetaminophen (NORCO/VICODIN) 5-325 MG tablet Take 1 tablet by mouth every 4 (four) hours as needed. 01/23/19   [provider]  lisinopril (PRINIVIL,ZESTRIL) 40 MG tablet Take 40 mg by mouth daily.     [provider]  metFORMIN (GLUCOPHAGE) 1000 MG tablet Take 1,000 mg by mouth 2 (two) times daily.     [provider]  metoprolol succinate (TOPROL-XL) 25 MG 24 hr tablet Take 1 tablet (25 mg total) by mouth daily. 04/08/19   Baldo DaubMunley, Brian J, MD  pantoprazole (PROTONIX) 40 MG tablet Take 40 mg by mouth daily. 03/07/19   [provider]  tamsulosin (FLOMAX) 0.4 MG CAPS capsule Take 0.4 mg by mouth daily.     [provider]  venlafaxine XR (EFFEXOR XR) 75 MG 24 hr capsule Take 75 mg by mouth daily.     [provider]    Family History Family History  Problem Relation Age of Onset  . Heart attack Neg Hx   . Stroke Neg Hx   . Diabetes Neg Hx     Social  History Social History   Tobacco Use  . Smoking status: Current Every Day Smoker    Packs/day: 1.00    Years: 50.00    Pack years: 50.00    Types: Cigarettes  . Smokeless tobacco: Never Used  Substance Use Topics  . Alcohol use: Never    Alcohol/week: 0.0 standard drinks    Frequency: Never  . Drug use: Never     Allergies   Meperidine   Review of Systems Review of Systems  Constitutional: Negative for chills, diaphoresis, fatigue and fever.  Respiratory: Positive for cough and shortness of breath.   Cardiovascular: Negative for chest pain and leg swelling.  Gastrointestinal: Negative for abdominal pain and vomiting.  Neurological: Negative for dizziness, syncope,  weakness, light-headedness, numbness and headaches.  All other systems reviewed and are negative.  Physical Exam Updated Vital Signs BP (!) 160/74 (BP Location: Right Arm)   Pulse 61   Temp 97.8 F (36.6 C) (Oral)   Resp 18   Ht  (1.778 m)   Wt 64.9 kg   SpO2 100%   BMI 20.52 kg/m   Physical Exam Vitals signs and nursing note reviewed.  Constitutional:      General: He is not in acute distress.    Appearance: He is not toxic-appearing.  HENT:     Head: Normocephalic and atraumatic.  Eyes:     General:        Right eye: No discharge.        Left eye: No discharge.     Conjunctiva/sclera: Conjunctivae normal.  Neck:     Musculoskeletal: Neck supple.  Cardiovascular:     Rate and Rhythm: Normal rate and regular rhythm.  Pulmonary:     Effort: Pulmonary effort is normal. No respiratory distress.     Comments: Course breath sounds throughout. No wheezing.  Abdominal:     General: There is no distension.     Palpations: Abdomen is soft.     Tenderness: There is no abdominal tenderness.  Musculoskeletal:     Right lower leg: No edema.     Left lower leg: No edema.  Skin:    General: Skin is warm and dry.     Findings: No rash.  Neurological:     Mental Status: He is alert.     Comments: Clear speech.   Psychiatric:        Behavior: Behavior normal.    ED Treatments / Results  Labs (all labs ordered are listed, but only abnormal results are displayed) Labs Reviewed  COMPREHENSIVE METABOLIC PANEL - Abnormal; Notable for the following components:      Result Value   Sodium 124 (*)    Chloride 92 (*)    Glucose, Bld 157 (*)    Calcium 8.7 (*)    All other components within normal limits  CBC WITH DIFFERENTIAL/PLATELET - Abnormal; Notable for the following components:   Hemoglobin 10.9 (*)    HCT 34.3 (*)    MCV 79.0 (*)    MCH 25.1 (*)    RDW 16.4 (*)    All other components within normal limits  BRAIN NATRIURETIC PEPTIDE - Abnormal; Notable for the  following components:   B Natriuretic Peptide 1,945.7 (*)    All other components within normal limits  TROPONIN I (HIGH SENSITIVITY)  TROPONIN I (HIGH SENSITIVITY)    EKG EKG Interpretation  Date/Time:  Wednesday April 09 2019 11:18:41 EST Ventricular Rate:  68 PR Interval:  202 QRS Duration: 84  QT Interval:  388 QTC Calculation: 412 R Axis:   65 Text Interpretation: Normal sinus rhythm Minimal voltage criteria for LVH, may be normal variant ( Cornell product ) Anterior infarct , age undetermined Abnormal ECG Confirmed by Gerlene Fee [phone]) on 04/09/2019 12:11:58 PM   Radiology Dg Chest 2 View  Result Date: 04/09/2019 CLINICAL DATA:  Dyspnea. EXAM: CHEST - 2 VIEW COMPARISON:  Chest x-rays dated 03/26/2019 and 08/02/2018 FINDINGS: The heart size and pulmonary vascularity are normal. Aortic atherosclerosis. There is a persistent area of faint haziness in the right midzone laterally, unchanged. There is a dense well-defined 6 mm nodule seen anteriorly on the lateral which is not identified on the PA view. This probably represents a granuloma. Lungs are otherwise clear. Lungs are somewhat hyperinflated with flattening of the diaphragm. No discrete effusions. No acute bone abnormality. IMPRESSION: 1. No acute abnormalities. 2. Stable faint haziness in the right midzone. 3. 9 mm nodule on the lateral view as described. Likely a granuloma based on the density and sharp definition of borders. 4. Aortic atherosclerosis. Electronically Signed   By: Lorriane Shire M.D.   On: 04/09/2019 12:31    Procedures Procedures (including critical care time)  Medications Ordered in ED Medications - No data to display   Echo 04/03/19:  IMPRESSIONS    1. Left ventricular ejection fraction, by visual estimation, is 35 to 40%. The left ventricle has moderate to severely decreased function. Left ventricular septal wall thickness was moderately increased. Moderately increased left ventricular  posterior  wall thickness. There is moderately increased left ventricular hypertrophy.  2. Left ventricular diastolic parameters are consistent with Grade I diastolic dysfunction (impaired relaxation).  3. The left ventricle demonstrates global hypokinesis.  4. Global right ventricle has normal systolic function.The right ventricular size is normal. No increase in right ventricular wall thickness.  5. Left atrial size was normal.  6. Right atrial size was normal.  7. The mitral valve is normal in structure. No evidence of mitral valve regurgitation. No evidence of mitral stenosis.  8. The tricuspid valve is normal in structure. Tricuspid valve regurgitation is not demonstrated.  9. The aortic valve is tricuspid. Aortic valve regurgitation is not visualized. No evidence of aortic valve sclerosis or stenosis. 10. The pulmonic valve was normal in structure. Pulmonic valve regurgitation is not visualized. 11. The inferior vena cava is normal in size with greater than 50% respiratory variability, suggesting right atrial pressure of 3 mmHg.   Initial Impression / Assessment and Plan / ED Course  I have reviewed the triage vital signs and the nursing notes.  Pertinent labs & imaging results that were available during my care of the patient were reviewed by me and considered in my medical decision making (see chart for details).   Patient presents to the ED w/ intermittent dyspnea & dry cough x 1 week, had outpatient labs by cardiology yesterday w/ abnormalities therefore sent to the ED.  Chart reviewed:  Echocardiogram 04/03/19  EF 35-40%, LV with moderate to severe decreased function. Grade I diastolic dysfunctioned. Additional findings as above.  Labs yesterday 11/3: Pro BNP: elevated @ 31,084.  Mild troponin elevated @ 0.05. Hyponatremia @ 128. Anemia w/ hgb 11.7 hct 36.9.  Nontoxic appearing. BP elevated, vitals otherwise WNL. Exam w/ course breath sounds.   CBC: Progressing anemia w/ hgb  10.9 hct 34.3. No leukocytosis.  CMP: Progressing hyponatremia 124. Additional mild hypochloremia & hypocalcemia. Renal function WNL.  High sensitivity troponin: 16, 13 BNP: elevated @ 1945.7.  EKG: Normal sinus rhythm Minimal voltage criteria for LVH, may be normal variant ( Cornell product ) Anterior infarct , age undetermined Abnormal ECG. No STEMI CXR: IMPRESSION: 1. No acute abnormalities. 2. Stable faint haziness in the right midzone. 3. 9 mm nodule on the lateral view as described. Likely a granuloma based on the density and sharp definition of borders. 4. Aortic atherosclerosis.  Patient w/ CHF & worsening hyponatremia- feel he warrants admission which he is against.   14:31: CONSULT: Discussed with patient's cardiologist Dr. Dulce Sellar who had seen patient yesterday, states he has significant declined in terms of his overall health status- he strongly recommending admission for right/left heart catheterization, relayed this information to the patient who is adamant that he is not staying, discussed medication management with Dr. Dulce Sellar given his presentation/results today- he recommends discontinuing Lisinopril, start Valsartan 80 mg BID for 2 weeks & give PRN nitroglycerin with close follow up if patient will not stay.   I again discussed with the patient & his wife @ bedside that we & his cardiologist Dr. Dulce Sellar are recommending admission. He ultimately refused & will sign out AGAINST MEDICAL ADVICE.   The patient, Dr. Pilar Plate, and I discussed the nature and purpose, risks and benefits, as well as, the alternatives of treatment. Time was given to allow the opportunity to ask questions and consider options. After the discussion, the patient decided to refuse admission. The patient was informed that refusal could lead to, but was not limited to, death, permanent disability, or severe pain. If present, I asked the relatives and/or significant others to dissuade the patient without success. Prior to  refusing, I determined that the patient had the capacity to make their decision and understood the consequences of that decision. After refusal, I made every reasonable effort to treat them to the best of my ability.  The patient was notified that they may return to the emergency department at any time.    Final Clinical Impressions(s) / ED Diagnoses   Final diagnoses:  Dyspnea, unspecified type  Congestive heart failure, unspecified HF chronicity, unspecified heart failure type (HCC)  Hyponatremia    ED Discharge Orders         Ordered    valsartan (DIOVAN) 80 MG tablet  2 times daily     04/09/19 1440    nitroGLYCERIN (NITROSTAT) 0.4 MG SL tablet  Every 5 min PRN     04/09/19 1440           Leilani Cespedes, [place] R, PA-C 04/09/19 1506    Sabas Sous, MD 04/10/19 1458

## 2019-04-09 NOTE — ED Triage Notes (Signed)
C/o SOB x 1+week-denies pain-NAD-steady gait-RT was in triage to assess

## 2019-04-09 NOTE — ED Notes (Signed)
Pt refusing discharge vitals at this time, papers reviewed and pt signed out AMA.

## 2019-04-09 NOTE — ED Notes (Signed)
Provider at bedside

## 2019-04-09 NOTE — Telephone Encounter (Signed)
I would like him to go to [facility] ED for evaluation high sensitivity troponin now

## 2019-04-09 NOTE — ED Notes (Signed)
ED Provider at bedside. 

## 2019-04-09 NOTE — ED Notes (Signed)
Patient transported to X-ray 

## 2019-04-09 NOTE — ED Notes (Signed)
Pt denies chest pain at this time.  No distress. Denies respiratory difficulty, states "if a doctor doesn't come in here in 15 minutes I'm walking out"

## 2019-04-09 NOTE — ED Notes (Signed)
Dr Bero at bedside.  

## 2019-04-09 NOTE — Telephone Encounter (Signed)
Dominic Barber with [facility] called to notify us of patient's elevated troponin I results of 0.05. Please advise. Thanks!

## 2019-04-09 NOTE — Telephone Encounter (Signed)
Patient called and advised to go to the [facility] Emergency Department for further evaluation as soon as possible today due to elevated troponin I. Patient was provided with the address and verbalized understanding. He agreed to plan. No further questions.   Patient called back right after our conversation saying "I am not going today. I have been back and forth to doctor's offices the past couple of days. I will go tomorrow." Expressed the importance of him seeking care at the ED as soon as possible. Patient was adamant that he was not going today and ended the call abruptly.   Patient called back again and spoke with Lattie Haw at the front desk. He stated that he changed his mind and will go to the ED today.

## 2019-04-09 NOTE — Discharge Instructions (Signed)
You were seen in the emergency department today for trouble breathing.  We have recommended you be admitted to the hospital for heart failure, low sodium levels, and a heart catheterization.  You are leaving AGAINST MEDICAL ADVICE  We have spoken with Dr. Bettina Gavia, your cardiologist, please stop taking lisinopril and start taking valsartan 80 mg twice per day for the next 2 weeks.  We have sent in a prescription for valsartan.  We have also sent a prescription for as needed nitroglycerin, take 1 tablet as needed for chest pain.  If you need to take more than 1 tablet you need to return to the emergency department.  We have prescribed you new medication(s) today. Discuss the medications prescribed today with your pharmacist as they can have adverse effects and interactions with your other medicines including over the counter and prescribed medications. Seek medical evaluation if you start to experience new or abnormal symptoms after taking one of these medicines, seek care immediately if you start to experience difficulty breathing, feeling of your throat closing, facial swelling, or rash as these could be indications of a more serious allergic reaction  It is extremely important that you follow-up with your cardiologist within 24 hours. Should you change your mind about admission please return to the emergency department at any time.  Return immediately for new or worsening symptoms or any other concerns especially chest pain, worsening shortness of breath, persistent shortness of breath, passing out, or any other concerns.

## 2019-04-15 DIAGNOSIS — E871 Hypo-osmolality and hyponatremia: Secondary | ICD-10-CM | POA: Diagnosis not present

## 2019-04-24 DIAGNOSIS — R05 Cough: Secondary | ICD-10-CM | POA: Diagnosis not present

## 2019-04-24 DIAGNOSIS — I5021 Acute systolic (congestive) heart failure: Secondary | ICD-10-CM | POA: Diagnosis not present

## 2019-04-24 DIAGNOSIS — R0602 Shortness of breath: Secondary | ICD-10-CM | POA: Diagnosis not present

## 2019-05-04 NOTE — Progress Notes (Signed)
Cardiology Office Note:    Date:  05/06/2019   ID:  Dominic Barber, DOB 06-27-1945, MRN [id number]  PCP:  Crist FatVan Eyk, Jason, MD  Cardiologist:  Norman HerrlichBrian Emmi Wertheim, MD    Referring MD: Crist FatVan Eyk, Jason, MD    ASSESSMENT:    1. Chronic systolic heart failure (HCC)   2. Hypertensive heart disease with heart failure (HCC)   3. Hyperlipidemia, unspecified hyperlipidemia type   4. Noncompliance by refusing service   5. Hyponatremia    PLAN:    In order of problems listed above:  1. Remains symptomatic [place] Heart Association class II to class III he is not fluid overloaded continue his current loop diuretic optimize [facility] and consider up titration as well as adding spironolactone next visit access recent labs from his PCP office.  He declines consideration of angiography and revascularization but will wear a loop recorder and see if there is any evidence of life-threatening arrhythmia to justify consideration of ICD. 2. Continue guideline directed treatment for hypertension 3. Continue high intensity statin 4. I think I understand the patient's wishes and decisions I will try to optimize his care within these parameters 5. Obtain recent labs from his PCP office   Next appointment: 4 weeks   Medication Adjustments/Labs and Tests Ordered: Current medicines are reviewed at length with the patient today.  Concerns regarding medicines are outlined above.  No orders of the defined types were placed in this encounter.  No orders of the defined types were placed in this encounter.   Chief Complaint  Patient presents with  . Follow-up  . Congestive Heart Failure    History of Present Illness:    Dominic RobsonJohnnie Zangara is a 73 y.o. male with a hx of heart failure severely reduced ejection fraction 35 to 40%.  His EKG showed sinus rhythm age-indeterminate anteroseptal myocardial infarction with ischemic T wave inversion.  He was last seen 04/08/2019.  Compliance  with diet, lifestyle and medications: Yes  On 2 separate occasions he has refused hospitalization with what was felt to be acute coronary syndrome resulting in heart failure.  Most recent 04/09/2019.  Troponin drawn in my office was elevated troponin I 0.05, serum sodium was significantly depleted at 128 potassium 5.0 creatinine 1.09 and his proBNP level was extremely elevated greater than 31,000.  In response he is [facility] evaluation of ACS and hospitalization.  He was in decompensated heart failure advised admission right and left heart catheterization declined and left the hospital transitioning to ARB so that we could initiate Entresto as outpatient.  Echo 04/03/2019;  1. Left ventricular ejection fraction, by visual estimation, is 35 to 40%. The left ventricle has moderate to severely decreased function. Left ventricular septal wall thickness was moderately increased. Moderately increased left ventricular posterior  wall thickness. There is moderately increased left ventricular hypertrophy.  2. Left ventricular diastolic parameters are consistent with Grade I diastolic dysfunction (impaired relaxation).  3. The left ventricle demonstrates global hypokinesis.  4. Global right ventricle has normal systolic function.The right ventricular size is normal. No increase in right ventricular wall thickness.  5. Left atrial size was normal.  6. Right atrial size was normal.  7. The mitral valve is normal in structure. No evidence of mitral valve regurgitation. No evidence of mitral stenosis.  8. The tricuspid valve is normal in structure. Tricuspid valve regurgitation is not demonstrated.  9. The aortic valve is tricuspid. Aortic valve  regurgitation is not visualized. No evidence of aortic valve sclerosis or stenosis. 10. The pulmonic valve was normal in structure. Pulmonic valve regurgitation is not visualized. 11. The inferior vena cava is normal in size  with greater than 50% respiratory variability, suggesting right atrial pressure of 3 mmHg.  He is feeling a little bit better but still intermittently short of breath even at rest and has 2 pillow orthopnea.  He has no edema he has had no chest pain palpitation or syncope.  I discussed with him that he has severe left ventricular dysfunction likely severe CAD and appears had had a myocardial infarction about the time of the onset of heart failure.  We discussed the potential revascularization he declines.  He will wear a 2-week event monitor and if he has sustained VT have asked him to consider ICD therapy.  He is interested in optimizing medical treatment and we will transition from ARB to [facility] and I will see back in the office.  He tells me 2 weeks ago he had labs done his PCP office will try to access them.  He is aware of his poor prognosis. Past Medical History:  Diagnosis Date  . Anxiety   . BPH (benign prostatic hyperplasia)   . Depression   . Eczema   . Hyperlipidemia   . Hypertensive heart disease   . Syncope   . Type 2 diabetes mellitus (HCC)     Past Surgical History:  Procedure Laterality Date  . COLON SURGERY    . HERNIA REPAIR Bilateral   . SHOULDER SURGERY      Current Medications: Current Meds  Medication Sig  . ALPRAZolam (XANAX) 0.5 MG tablet Take 0.5 mg by mouth daily as needed.   Marland Kitchen atorvastatin (LIPITOR) 40 MG tablet Take 40 mg by mouth daily.   . furosemide (LASIX) 20 MG tablet Take 20 mg by mouth daily.  Marland Kitchen glipiZIDE (GLUCOTROL) 10 MG tablet Take 10 mg by mouth 2 (two) times daily before a meal.   . HYDROcodone-acetaminophen (NORCO/VICODIN) 5-325 MG tablet Take 1 tablet by mouth every 4 (four) hours as needed.  . metFORMIN (GLUCOPHAGE) 1000 MG tablet Take 1,000 mg by mouth 2 (two) times daily.   . metoprolol succinate (TOPROL-XL) 25 MG 24 hr tablet Take 1 tablet (25 mg total) by mouth daily.  . nitroGLYCERIN (NITROSTAT) 0.4 MG SL tablet Place 1 tablet (0.4 mg  total) under the tongue every 5 (five) minutes as needed for chest pain. If you take more than 1 tablet you need to come to the emergency department.  . pantoprazole (PROTONIX) 40 MG tablet Take 40 mg by mouth daily.  . tamsulosin (FLOMAX) 0.4 MG CAPS capsule Take 0.4 mg by mouth daily.   . valsartan (DIOVAN) 80 MG tablet Take 1 tablet (80 mg total) by mouth 2 (two) times daily for 14 days.  Marland Kitchen venlafaxine XR (EFFEXOR XR) 75 MG 24 hr capsule Take 75 mg by mouth daily.      Allergies:   Meperidine   Social History   Socioeconomic History  . Marital status: Married    Spouse name: Not on file  . Number of children: Not on file  . Years of education: Not on file  . Highest education level: Not on file  Occupational History  . Not on file  Social Needs  . Financial resource strain: Not on file  . Food insecurity    Worry: Not on file    Inability: Not on file  .  Transportation needs    Medical: Not on file    Non-medical: Not on file  Tobacco Use  . Smoking status: Current Every Day Smoker    Packs/day: 1.00    Years: 50.00    Pack years: 50.00    Types: Cigarettes  . Smokeless tobacco: Never Used  Substance and Sexual Activity  . Alcohol use: Never    Alcohol/week: 0.0 standard drinks    Frequency: Never  . Drug use: Never  . Sexual activity: Not on file  Lifestyle  . Physical activity    Days per week: Not on file    Minutes per session: Not on file  . Stress: Not on file  Relationships  . Social Herbalist on phone: Not on file    Gets together: Not on file    Attends religious service: Not on file    Active member of club or organization: Not on file    Attends meetings of clubs or organizations: Not on file    Relationship status: Not on file  Other Topics Concern  . Not on file  Social History Narrative  . Not on file     Family History: The patient's family history is negative for Heart attack, Stroke, and Diabetes. ROS:   Please see the  history of present illness.    All other systems reviewed and are negative.  EKGs/Labs/Other Studies Reviewed:    The following studies were reviewed today:  Recent Labs:  04/07/2019: Hemoglobin 10.4 microcytic indices MCV is 77 Creatinine 1.05 potassium 5.4 sodium 128 EKG 04/07/2019 showed sinus rhythm LVH ischemic ST-T abnormality prominent in the anterior leads.  04/08/2019: NT-Pro BNP 31,084 04/09/2019: ALT 12; B Natriuretic Peptide 1,945.7; BUN 19; Creatinine, Ser 1.08; Hemoglobin 10.9; Platelets 372; Potassium 4.4; Sodium 124    Physical Exam:    VS:  BP (!) 148/78 (BP Location: Right Arm, Patient Position: Sitting, Cuff Size: Normal)   Pulse (!) 46   Ht 5\' 10"  (1.778 m)   Wt 138 lb 6.4 oz (62.8 kg)   SpO2 99%   BMI 19.86 kg/m     Wt Readings from Last 3 Encounters:  05/06/19 138 lb 6.4 oz (62.8 kg)  04/09/19 143 lb (64.9 kg)  04/08/19 143 lb 9.6 oz (65.1 kg)     GEN: Looks very frail chronically ill and nearly cachectic well nourished, well developed in no acute distress HEENT: Normal NECK: No JVD; No carotid bruits LYMPHATICS: No lymphadenopathy CARDIAC: Soft S1 RRR, no murmurs, rubs, gallops RESPIRATORY:  Clear to auscultation without rales, wheezing or rhonchi  ABDOMEN: Soft, non-tender, non-distended MUSCULOSKELETAL:  No edema; No deformity  SKIN: Warm and dry NEUROLOGIC:  Alert and oriented x 3 PSYCHIATRIC:  Normal affect    Signed, Shirlee More, MD  05/06/2019 11:23 AM    [facility]

## 2019-05-06 ENCOUNTER — Other Ambulatory Visit: Payer: Self-pay

## 2019-05-06 ENCOUNTER — Encounter: Payer: Self-pay | Admitting: *Deleted

## 2019-05-06 ENCOUNTER — Ambulatory Visit (INDEPENDENT_AMBULATORY_CARE_PROVIDER_SITE_OTHER): Payer: PPO | Admitting: Cardiology

## 2019-05-06 ENCOUNTER — Encounter: Payer: Self-pay | Admitting: Cardiology

## 2019-05-06 ENCOUNTER — Ambulatory Visit (INDEPENDENT_AMBULATORY_CARE_PROVIDER_SITE_OTHER): Payer: PPO

## 2019-05-06 VITALS — BP 148/78 | HR 46 | Ht 70.0 in | Wt 138.4 lb

## 2019-05-06 DIAGNOSIS — Z5329 Procedure and treatment not carried out because of patient's decision for other reasons: Secondary | ICD-10-CM

## 2019-05-06 DIAGNOSIS — I5022 Chronic systolic (congestive) heart failure: Secondary | ICD-10-CM

## 2019-05-06 DIAGNOSIS — I110 Hypertensive heart disease with heart failure: Secondary | ICD-10-CM

## 2019-05-06 DIAGNOSIS — E785 Hyperlipidemia, unspecified: Secondary | ICD-10-CM | POA: Diagnosis not present

## 2019-05-06 DIAGNOSIS — Z91199 Patient's noncompliance with other medical treatment and regimen due to unspecified reason: Secondary | ICD-10-CM

## 2019-05-06 DIAGNOSIS — R42 Dizziness and giddiness: Secondary | ICD-10-CM

## 2019-05-06 DIAGNOSIS — E871 Hypo-osmolality and hyponatremia: Secondary | ICD-10-CM | POA: Diagnosis not present

## 2019-05-06 MED ORDER — METOPROLOL SUCCINATE ER 25 MG PO TB24: 12.5000 mg | ORAL_TABLET | Freq: Every day | ORAL | 1 refills | Status: DC

## 2019-05-06 MED ORDER — SACUBITRIL-VALSARTAN 24-26 MG PO TABS: 1.0000 | ORAL_TABLET | Freq: Two times a day (BID) | ORAL | 3 refills | Status: DC

## 2019-05-06 NOTE — Patient Instructions (Signed)
Medication Instructions:  Your physician has recommended you make the following change in your medication:   STOP valsartan   START sacubitril-valsartan (entresto) 24-26 mg: Take 1 tablet twice daily   DECREASE metoprolol succinate (toprol-XL) 25 mg: Take 0.5 tablet (12.5 mg) daily   *If you need a refill on your cardiac medications before your next appointment, please call your pharmacy*  Lab Work: None  If you have labs (blood work) drawn today and your tests are completely normal, you will receive your results only by: Dominic Barber MyChart Message (if you have MyChart) OR . A paper copy in the mail If you have any lab test that is abnormal or we need to change your treatment, we will call you to review the results.  Testing/Procedures: Your physician has recommended that you wear a ZIO monitor. ZIO monitors are medical devices that record the heart's electrical activity. Doctors most often use these monitors to diagnose arrhythmias. Arrhythmias are problems with the speed or rhythm of the heartbeat. The monitor is a small, portable device. You can wear one while you do your normal daily activities. This is usually used to diagnose what is causing palpitations/syncope (passing out). Wear for 14 days.   Follow-Up: At [facility], you and your health needs are our priority.  As part of our continuing mission to provide you with exceptional heart care, we have created designated Provider Care Teams.  These Care Teams include your primary Cardiologist (physician) and Advanced Practice Providers (APPs -  Physician Assistants and Nurse Practitioners) who all work together to provide you with the care you need, when you need it.  Your next appointment:   6 week(s)  The format for your next appointment:   In Person  Provider:   Norman Herrlich, MD   Sacubitril; Valsartan oral tablet What is this medicine? SACUBITRIL; VALSARTAN (sak UE bi tril; val SAR tan) is a combination of 2 drugs used to  reduce the risk of death and hospitalizations in people with long-lasting heart failure. It is usually used with other medicines to treat heart failure. This medicine may be used for other purposes; ask your health care provider or pharmacist if you have questions. COMMON BRAND NAME(S): Entresto What should I tell my health care provider before I take this medicine? They need to know if you have any of these conditions:  diabetes and take a medicine that contains aliskiren  kidney disease  liver disease  an unusual or allergic reaction to sacubitril; valsartan, drugs called angiotensin converting enzyme (ACE) inhibitors, angiotensin II receptor blockers (ARBs), other medicines, foods, dyes, or preservatives  pregnant or trying to get pregnant  breast-feeding How should I use this medicine? Take this medicine by mouth with a glass of water. Follow the directions on the prescription label. You can take it with or without food. If it upsets your stomach, take it with food. Take your medicine at regular intervals. Do not take it more often than directed. Do not stop taking except on your doctor's advice. Do not take this medicine for at least 36 hours before or after you take an ACE inhibitor medicine. Talk to your health care provider if you are not sure if you take an ACE inhibitor. Talk to your pediatrician regarding the use of this medicine in children. Special care may be needed. Overdosage: If you think you have taken too much of this medicine contact a poison control center or emergency room at once. NOTE: This medicine is only for you. Do  not share this medicine with others. What if I miss a dose? If you miss a dose, take it as soon as you can. If it is almost time for next dose, take only that dose. Do not take double or extra doses. What may interact with this medicine? Do not take this medicine with any of the following medicines:  aliskiren if you have  diabetes  angiotensin-converting enzyme (ACE) inhibitors, like benazepril, captopril, enalapril, fosinopril, lisinopril, or ramipril This medicine may also interact with the following medicines:  angiotensin II receptor blockers (ARBs) like azilsartan, candesartan, eprosartan, irbesartan, losartan, olmesartan, telmisartan, or valsartan  lithium  NSAIDS, medicines for pain and inflammation, like ibuprofen or naproxen  potassium-sparing diuretics like amiloride, spironolactone, and triamterene  potassium supplements This list may not describe all possible interactions. Give your health care provider a list of all the medicines, herbs, non-prescription drugs, or dietary supplements you use. Also tell them if you smoke, drink alcohol, or use illegal drugs. Some items may interact with your medicine. What should I watch for while using this medicine? Tell your doctor or healthcare professional if your symptoms do not start to get better or if they get worse. Do not become pregnant while taking this medicine. Women should inform their doctor if they wish to become pregnant or think they might be pregnant. There is a potential for serious side effects to an unborn child. Talk to your health care professional or pharmacist for more information. You may get dizzy. Do not drive, use machinery, or do anything that needs mental alertness until you know how this medicine affects you. Do not stand or sit up quickly, especially if you are an older patient. This reduces the risk of dizzy or fainting spells. Avoid alcoholic drinks; they can make you more dizzy. What side effects may I notice from receiving this medicine? Side effects that you should report to your doctor or health care professional as soon as possible:  allergic reactions like skin rash, itching or hives, swelling of the face, lips, or tongue  signs and symptoms of increased potassium like muscle weakness; chest pain; or fast, irregular  heartbeat  signs and symptoms of kidney injury like trouble passing urine or change in the amount of urine  signs and symptoms of low blood pressure like feeling dizzy or lightheaded, or if you develop extreme fatigue Side effects that usually do not require medical attention (report to your doctor or health care professional if they continue or are bothersome):  cough This list may not describe all possible side effects. Call your doctor for medical advice about side effects. You may report side effects to FDA at 1-800-FDA-1088. Where should I keep my medicine? Keep out of the reach of children. Store at room temperature between 15 and 30 degrees C (59 and 86 degrees F). Throw away any unused medicine after the expiration date. NOTE: This sheet is a summary. It may not cover all possible information. If you have questions about this medicine, talk to your doctor, pharmacist, or health care provider.  2020 Elsevier/Gold Standard (2015-07-07 13:54:19)

## 2019-05-08 ENCOUNTER — Telehealth: Payer: Self-pay | Admitting: Cardiology

## 2019-05-08 ENCOUNTER — Encounter: Payer: Self-pay | Admitting: *Deleted

## 2019-05-08 DIAGNOSIS — I110 Hypertensive heart disease with heart failure: Secondary | ICD-10-CM | POA: Insufficient documentation

## 2019-05-08 DIAGNOSIS — I5022 Chronic systolic (congestive) heart failure: Secondary | ICD-10-CM | POA: Insufficient documentation

## 2019-05-08 DIAGNOSIS — I5023 Acute on chronic systolic (congestive) heart failure: Secondary | ICD-10-CM | POA: Insufficient documentation

## 2019-05-08 NOTE — Telephone Encounter (Signed)
Waiting for a prior auth on entresto

## 2019-05-08 NOTE — Telephone Encounter (Signed)
Prior authorization for entresto has been completed on [email or web address]. Will update patient when a decision has been made.

## 2019-05-13 ENCOUNTER — Telehealth: Payer: Self-pay | Admitting: *Deleted

## 2019-05-13 DIAGNOSIS — I110 Hypertensive heart disease with heart failure: Secondary | ICD-10-CM

## 2019-05-13 DIAGNOSIS — I5022 Chronic systolic (congestive) heart failure: Secondary | ICD-10-CM

## 2019-05-13 DIAGNOSIS — E875 Hyperkalemia: Secondary | ICD-10-CM

## 2019-05-13 MED ORDER — LOKELMA 10 G PO PACK: 10.0000 g | PACK | ORAL | 0 refills | Status: DC

## 2019-05-13 NOTE — Telephone Encounter (Signed)
Patient informed of Dr. Joya Gaskins recommendations and he is agreeable to plan. He denies taking any potassium supplements and he has started taking entresto 24-26 mg twice daily as prescribed. Patient advised to start lokelma 10 grams three times weekly on Monday, Wednesday, and Friday. Patient will return to our office for repeat lab work (BMP) in 2 weeks, no appointment needed. No need to fast beforehand. Patient verbalized understanding. No further questions.

## 2019-05-13 NOTE — Telephone Encounter (Signed)
Prior authorization for entresto has been approved. Patient informed and has started taking this medication as prescribed.

## 2019-05-13 NOTE — Telephone Encounter (Signed)
-----   Message from Dominic Priest, MD sent at 05/10/2019  2:03 PM EST ----- His potassium is elevated please have him stop taking supplements if he is using them even over-the-counter.  It is important that he takes Delene Loll he can exacerbate an elevated potassium and I like him to start using Lokelma 10 g 3 days a week Monday Wednesday and Friday in about 2 weeks to recheck a BMP.

## 2019-05-28 ENCOUNTER — Telehealth: Payer: Self-pay | Admitting: Cardiology

## 2019-05-28 DIAGNOSIS — R42 Dizziness and giddiness: Secondary | ICD-10-CM | POA: Diagnosis not present

## 2019-05-28 NOTE — Telephone Encounter (Signed)
Patient called and states that the pharmacy doesn't have the [facility] meds that Dr. Bettina Gavia wants him to take (sodium).  I called the [facility] to clarify if patient was stating that the pharmacy did not get the script or if the pharmacy could just NOT get medicine.. that is the case they pharmacy can NOT get the medicine and suggested that we try something else.  In the meantime the pharmacy is trying to get the med from somewhere else and see if we can change meds for patient.. Please let patient know and I let pt know that Dr. Bettina Gavia is on vacation this week.

## 2019-05-28 NOTE — Telephone Encounter (Signed)
Please advise of any possible change

## 2019-05-28 NOTE — Telephone Encounter (Signed)
I would wait until available

## 2019-05-29 NOTE — Telephone Encounter (Signed)
Telephone call to patient. Informed it was OK to wait until pharmacy fills the prescription.

## 2019-05-30 DIAGNOSIS — R000 Tachycardia, unspecified: Secondary | ICD-10-CM | POA: Diagnosis not present

## 2019-05-30 DIAGNOSIS — I2699 Other pulmonary embolism without acute cor pulmonale: Secondary | ICD-10-CM | POA: Diagnosis not present

## 2019-05-30 DIAGNOSIS — I10 Essential (primary) hypertension: Secondary | ICD-10-CM | POA: Diagnosis not present

## 2019-05-30 DIAGNOSIS — E1165 Type 2 diabetes mellitus with hyperglycemia: Secondary | ICD-10-CM | POA: Diagnosis not present

## 2019-05-30 DIAGNOSIS — R0902 Hypoxemia: Secondary | ICD-10-CM | POA: Diagnosis not present

## 2019-05-30 DIAGNOSIS — I340 Nonrheumatic mitral (valve) insufficiency: Secondary | ICD-10-CM | POA: Diagnosis not present

## 2019-05-30 DIAGNOSIS — U071 COVID-19: Secondary | ICD-10-CM | POA: Diagnosis not present

## 2019-05-30 DIAGNOSIS — I361 Nonrheumatic tricuspid (valve) insufficiency: Secondary | ICD-10-CM | POA: Diagnosis not present

## 2019-05-30 DIAGNOSIS — R778 Other specified abnormalities of plasma proteins: Secondary | ICD-10-CM | POA: Diagnosis not present

## 2019-05-30 DIAGNOSIS — R0602 Shortness of breath: Secondary | ICD-10-CM | POA: Diagnosis not present

## 2019-05-30 DIAGNOSIS — E119 Type 2 diabetes mellitus without complications: Secondary | ICD-10-CM | POA: Diagnosis not present

## 2019-05-30 DIAGNOSIS — R062 Wheezing: Secondary | ICD-10-CM | POA: Diagnosis not present

## 2019-05-31 ENCOUNTER — Inpatient Hospital Stay (HOSPITAL_COMMUNITY)
Admission: AD | Admit: 2019-05-31 | Discharge: 2019-06-03 | DRG: 175 | Disposition: A | Discharge status: Home / Self Care | Payer: PPO | Source: Other Acute Inpatient Hospital | Attending: Internal Medicine | Admitting: Internal Medicine

## 2019-05-31 DIAGNOSIS — J90 Pleural effusion, not elsewhere classified: Secondary | ICD-10-CM

## 2019-05-31 DIAGNOSIS — I110 Hypertensive heart disease with heart failure: Secondary | ICD-10-CM | POA: Diagnosis present

## 2019-05-31 DIAGNOSIS — Z20828 Contact with and (suspected) exposure to other viral communicable diseases: Secondary | ICD-10-CM | POA: Diagnosis not present

## 2019-05-31 DIAGNOSIS — I5042 Chronic combined systolic (congestive) and diastolic (congestive) heart failure: Secondary | ICD-10-CM | POA: Diagnosis not present

## 2019-05-31 DIAGNOSIS — I2699 Other pulmonary embolism without acute cor pulmonale: Secondary | ICD-10-CM | POA: Diagnosis present

## 2019-05-31 DIAGNOSIS — Z79899 Other long term (current) drug therapy: Secondary | ICD-10-CM

## 2019-05-31 DIAGNOSIS — D509 Iron deficiency anemia, unspecified: Secondary | ICD-10-CM | POA: Diagnosis present

## 2019-05-31 DIAGNOSIS — N179 Acute kidney failure, unspecified: Secondary | ICD-10-CM | POA: Diagnosis present

## 2019-05-31 DIAGNOSIS — I2693 Single subsegmental pulmonary embolism without acute cor pulmonale: Principal | ICD-10-CM | POA: Diagnosis present

## 2019-05-31 DIAGNOSIS — I5023 Acute on chronic systolic (congestive) heart failure: Secondary | ICD-10-CM | POA: Diagnosis present

## 2019-05-31 DIAGNOSIS — I429 Cardiomyopathy, unspecified: Secondary | ICD-10-CM | POA: Diagnosis present

## 2019-05-31 DIAGNOSIS — Z66 Do not resuscitate: Secondary | ICD-10-CM | POA: Diagnosis present

## 2019-05-31 DIAGNOSIS — J9621 Acute and chronic respiratory failure with hypoxia: Secondary | ICD-10-CM | POA: Diagnosis not present

## 2019-05-31 DIAGNOSIS — E119 Type 2 diabetes mellitus without complications: Secondary | ICD-10-CM

## 2019-05-31 DIAGNOSIS — J9601 Acute respiratory failure with hypoxia: Secondary | ICD-10-CM | POA: Diagnosis present

## 2019-05-31 DIAGNOSIS — U071 COVID-19: Secondary | ICD-10-CM | POA: Diagnosis present

## 2019-05-31 DIAGNOSIS — R0602 Shortness of breath: Secondary | ICD-10-CM | POA: Diagnosis present

## 2019-05-31 DIAGNOSIS — N400 Enlarged prostate without lower urinary tract symptoms: Secondary | ICD-10-CM | POA: Diagnosis present

## 2019-05-31 DIAGNOSIS — E1169 Type 2 diabetes mellitus with other specified complication: Secondary | ICD-10-CM

## 2019-05-31 DIAGNOSIS — J449 Chronic obstructive pulmonary disease, unspecified: Secondary | ICD-10-CM | POA: Diagnosis not present

## 2019-05-31 DIAGNOSIS — E875 Hyperkalemia: Secondary | ICD-10-CM | POA: Diagnosis present

## 2019-05-31 DIAGNOSIS — Z7984 Long term (current) use of oral hypoglycemic drugs: Secondary | ICD-10-CM

## 2019-05-31 DIAGNOSIS — I2609 Other pulmonary embolism with acute cor pulmonale: Secondary | ICD-10-CM | POA: Diagnosis not present

## 2019-05-31 LAB — ABO/RH: ABO/RH(D): O POS

## 2019-05-31 LAB — TROPONIN I (HIGH SENSITIVITY): Troponin I (High Sensitivity): 461 ng/L (ref <18)

## 2019-05-31 LAB — GLUCOSE, CAPILLARY: Glucose-Capillary: 268 mg/dL — ABNORMAL HIGH (ref 70–99)

## 2019-05-31 LAB — BRAIN NATRIURETIC PEPTIDE: B Natriuretic Peptide: >4500 pg/mL — ABNORMAL HIGH (ref 0.0–100.0)

## 2019-05-31 LAB — HEMOGLOBIN A1C
Hgb A1c MFr Bld: 6.1 % — ABNORMAL HIGH (ref 4.8–5.6)
Mean Plasma Glucose: 128.37 mg/dL

## 2019-05-31 MED ORDER — METOPROLOL SUCCINATE ER 25 MG PO TB24
25.0000 mg | ORAL_TABLET | Freq: Every day | ORAL | Status: DC
  Administered 2019-06-01 – 2019-06-03 (×3): 25 mg via ORAL
  Filled 2019-05-31 (×3): qty 1

## 2019-05-31 MED ORDER — ONDANSETRON HCL 4 MG/2ML IJ SOLN: 4.0000 mg | Freq: Four times a day (QID) | INTRAMUSCULAR | Status: DC | PRN

## 2019-05-31 MED ORDER — HYDRALAZINE HCL 20 MG/ML IJ SOLN
10.0000 mg | INTRAMUSCULAR | Status: DC | PRN
  Administered 2019-06-01: 20 mg via INTRAVENOUS
  Administered 2019-06-02 (×2): 10 mg via INTRAVENOUS
  Filled 2019-05-31 (×3): qty 1

## 2019-05-31 MED ORDER — FUROSEMIDE 20 MG PO TABS
20.0000 mg | ORAL_TABLET | Freq: Every day | ORAL | Status: DC
  Administered 2019-06-01 – 2019-06-03 (×3): 20 mg via ORAL
  Filled 2019-05-31 (×3): qty 1

## 2019-05-31 MED ORDER — HYDROCOD POLST-CPM POLST ER 10-8 MG/5ML PO SUER: 5.0000 mL | Freq: Two times a day (BID) | ORAL | Status: DC | PRN

## 2019-05-31 MED ORDER — ALBUTEROL SULFATE HFA 108 (90 BASE) MCG/ACT IN AERS
2.0000 | INHALATION_SPRAY | Freq: Four times a day (QID) | RESPIRATORY_TRACT | Status: DC
  Filled 2019-05-31: qty 6.7

## 2019-05-31 MED ORDER — PANTOPRAZOLE SODIUM 40 MG PO TBEC
40.0000 mg | DELAYED_RELEASE_TABLET | Freq: Every day | ORAL | Status: DC
  Administered 2019-06-01 – 2019-06-03 (×3): 40 mg via ORAL
  Filled 2019-05-31 (×3): qty 1

## 2019-05-31 MED ORDER — HEPARIN (PORCINE) 25000 UT/250ML-% IV SOLN
1300.0000 [IU]/h | INTRAVENOUS | Status: AC
  Administered 2019-05-31: 1100 [IU]/h via INTRAVENOUS
  Administered 2019-06-02: 1300 [IU]/h via INTRAVENOUS
  Filled 2019-05-31 (×3): qty 250

## 2019-05-31 MED ORDER — FUROSEMIDE 10 MG/ML IJ SOLN: 40.0000 mg | Freq: Every day | INTRAMUSCULAR | Status: DC

## 2019-05-31 MED ORDER — ONDANSETRON HCL 4 MG PO TABS: 4.0000 mg | ORAL_TABLET | Freq: Four times a day (QID) | ORAL | Status: DC | PRN

## 2019-05-31 MED ORDER — ACETAMINOPHEN 325 MG PO TABS: 650.0000 mg | ORAL_TABLET | Freq: Four times a day (QID) | ORAL | Status: DC | PRN

## 2019-05-31 MED ORDER — FERROUS SULFATE 325 (65 FE) MG PO TABS
325.0000 mg | ORAL_TABLET | Freq: Two times a day (BID) | ORAL | Status: DC
  Administered 2019-06-01 – 2019-06-03 (×5): 325 mg via ORAL
  Filled 2019-05-31 (×5): qty 1

## 2019-05-31 MED ORDER — INSULIN ASPART 100 UNIT/ML ~~LOC~~ SOLN
0.0000 [IU] | Freq: Three times a day (TID) | SUBCUTANEOUS | Status: DC
  Administered 2019-06-01: 2 [IU] via SUBCUTANEOUS
  Administered 2019-06-01: 3 [IU] via SUBCUTANEOUS
  Administered 2019-06-01: 5 [IU] via SUBCUTANEOUS
  Administered 2019-06-02: 3 [IU] via SUBCUTANEOUS
  Administered 2019-06-02: 8 [IU] via SUBCUTANEOUS
  Administered 2019-06-02: 5 [IU] via SUBCUTANEOUS
  Administered 2019-06-03: 2 [IU] via SUBCUTANEOUS

## 2019-05-31 MED ORDER — HYDRALAZINE HCL 20 MG/ML IJ SOLN: 10.0000 mg | INTRAMUSCULAR | Status: DC | PRN

## 2019-05-31 MED ORDER — ALBUTEROL SULFATE HFA 108 (90 BASE) MCG/ACT IN AERS
2.0000 | INHALATION_SPRAY | Freq: Four times a day (QID) | RESPIRATORY_TRACT | Status: DC | PRN
  Administered 2019-06-02: 2 via RESPIRATORY_TRACT
  Filled 2019-05-31: qty 6.7

## 2019-05-31 MED ORDER — ZOLPIDEM TARTRATE 5 MG PO TABS
5.0000 mg | ORAL_TABLET | Freq: Every evening | ORAL | Status: DC | PRN
  Administered 2019-05-31: 5 mg via ORAL
  Filled 2019-05-31: qty 1

## 2019-05-31 MED ORDER — GUAIFENESIN-DM 100-10 MG/5ML PO SYRP: 10.0000 mL | ORAL_SOLUTION | ORAL | Status: DC | PRN

## 2019-05-31 MED ORDER — INSULIN ASPART 100 UNIT/ML ~~LOC~~ SOLN
0.0000 [IU] | Freq: Every day | SUBCUTANEOUS | Status: DC
  Administered 2019-05-31: 3 [IU] via SUBCUTANEOUS

## 2019-05-31 NOTE — H&P (Signed)
History and Physical    Dominic Barber:[id number]:[id number] DOB: [birth year] DOA: 05/31/2019  PCP: Dominic FatVan Eyk, Jason, MD  Patient coming from: Home  I have personally briefly reviewed patient's [facility]  Chief Complaint: SOB  HPI: Dominic Barber is a 73 y.o. male with medical history significant of HTN cardiomyopathy CHF with EF 35-40% in Oct, DM2, HTN.  [facility] with 2 day h/o SOB.  No CP.  No prior h/o blood clot, no fevers / chills.  No recent long distance travel or surgeries.  Patient was COVID negative, CTA revealed acute segmental PEs, concern for R heart strain given Trop of 0.41.  CT also revealed moderate B pleural effusions.  Creat was 1.2, rose to 1.49 today.  BP was elevated, he was continued on metoprolol (actually looks like this was increased to 25mg  daily) but entresto was held.  He was put on hydralazine IV PRN for BP control.  Heparin gtt was started.  2d echo done today revealed EF was down to <20% and RVSP was 48.  [facility]  Currently: patient breathing well at rest, no O2 requirement at rest.  BP 170s systolic.   Review of Systems: As per HPI, otherwise all review of systems negative.  Past Medical History:  Diagnosis Date  . Anxiety   . BPH (benign prostatic hyperplasia)   . Depression   . Eczema   . Hyperlipidemia   . Hypertensive heart disease   . Syncope   . Type 2 diabetes mellitus (HCC)     Past Surgical History:  Procedure Laterality Date  . COLON SURGERY    . HERNIA REPAIR Bilateral   . SHOULDER SURGERY       reports that he has been smoking cigarettes. He has a 50.00 pack-year smoking history. He has never used smokeless tobacco. He reports that he does not drink alcohol or use drugs.  Allergies  Allergen Reactions  . Meperidine Nausea And Vomiting    Family History  Problem Relation Age of Onset  . Heart attack Neg Hx   . Stroke Neg Hx   .  Diabetes Neg Hx      Prior to Admission medications   Medication Sig Start Date End Date Taking? Authorizing Provider  ALPRAZolam Prudy Feeler(XANAX) 0.5 MG tablet Take 0.5 mg by mouth daily as needed.     [provider]  atorvastatin (LIPITOR) 40 MG tablet Take 40 mg by mouth daily.     [provider]  furosemide (LASIX) 20 MG tablet Take 20 mg by mouth daily. 03/31/19   [provider]  glipiZIDE (GLUCOTROL) 10 MG tablet Take 10 mg by mouth 2 (two) times daily before a meal.     [provider]  HYDROcodone-acetaminophen (NORCO/VICODIN) 5-325 MG tablet Take 1 tablet by mouth every 4 (four) hours as needed. 01/23/19   [provider]  metFORMIN (GLUCOPHAGE) 1000 MG tablet Take 1,000 mg by mouth 2 (two) times daily.     [provider]  metoprolol succinate (TOPROL-XL) 25 MG 24 hr tablet Take 0.5 tablets (12.5 mg total) by mouth daily. 05/06/19   Dominic DaubMunley, Brian J, MD  nitroGLYCERIN (NITROSTAT) 0.4 MG SL tablet Place 1 tablet (0.4 mg total) under the tongue every 5 (five) minutes as needed for chest pain. If you take more than 1 tablet you need to come to the emergency department. 04/09/19   Petrucelli, Samantha R, PA-C  pantoprazole (  PROTONIX) 40 MG tablet Take 40 mg by mouth daily. 03/07/19   [provider]  sacubitril-valsartan (ENTRESTO) 24-26 MG Take 1 tablet by mouth 2 (two) times daily. 05/06/19   Dominic Daub, MD  sodium zirconium cyclosilicate (LOKELMA) 10 g PACK packet Take 10 g by mouth 3 (three) times a week. Take on Monday, Wednesday, and Friday only. 05/14/19   Dominic Daub, MD  tamsulosin (FLOMAX) 0.4 MG CAPS capsule Take 0.4 mg by mouth daily.     [provider]  venlafaxine XR (EFFEXOR XR) 75 MG 24 hr capsule Take 75 mg by mouth daily.     [provider]  lisinopril (PRINIVIL,ZESTRIL) 40 MG tablet Take 40 mg by mouth daily.   04/09/19  [provider]    Physical Exam: Vitals:   05/31/19 2100  05/31/19 2120  BP:  (!) 175/84  Pulse:  82  Resp:  (!) 21  Temp:  98.4 F (36.9 C)  TempSrc:  Oral  Weight: 61.1 kg   Height: 5\' 10"  (1.778 m)     Constitutional: NAD, calm, comfortable Eyes: PERRL, lids and conjunctivae normal ENMT: Mucous membranes are moist. Posterior pharynx clear of any exudate or lesions.Normal dentition.  Neck: normal, supple, no masses, no thyromegaly Respiratory: clear to auscultation bilaterally, no wheezing, no crackles. Normal respiratory effort. No accessory muscle use.  Cardiovascular: Regular rate and rhythm, no murmurs / rubs / gallops. No extremity edema. 2+ pedal pulses. No carotid bruits.  Abdomen: no tenderness, no masses palpated. No hepatosplenomegaly. Bowel sounds positive.  Musculoskeletal: no clubbing / cyanosis. No joint deformity upper and lower extremities. Good ROM, no contractures. Normal muscle tone.  Skin: no rashes, lesions, ulcers. No induration Neurologic: CN 2-12 grossly intact. Sensation intact, DTR normal. Strength 5/5 in all 4.  Psychiatric: Normal judgment and insight. Alert and oriented x 3. Normal mood.    Labs on Admission: I have personally reviewed following labs and imaging studies  CBC: No results for input(s): WBC, NEUTROABS, HGB, HCT, MCV, PLT in the last 168 hours. Basic Metabolic Panel: No results for input(s): NA, K, CL, CO2, GLUCOSE, BUN, CREATININE, CALCIUM, MG, PHOS in the last 168 hours. GFR: CrCl cannot be calculated (Patient's most recent lab result is older than the maximum 21 days allowed.). Liver Function Tests: No results for input(s): AST, ALT, ALKPHOS, BILITOT, PROT, ALBUMIN in the last 168 hours. No results for input(s): LIPASE, AMYLASE in the last 168 hours. No results for input(s): AMMONIA in the last 168 hours. Coagulation Profile: No results for input(s): INR, PROTIME in the last 168 hours. Cardiac Enzymes: No results for input(s): CKTOTAL, CKMB, CKMBINDEX, TROPONINI in the last 168  hours. BNP (last 3 results) Recent Labs    04/08/19 1143  PROBNP 31,084*   HbA1C: Recent Labs    05/31/19 2140  HGBA1C 6.1*   CBG: No results for input(s): GLUCAP in the last 168 hours. Lipid Profile: No results for input(s): CHOL, HDL, LDLCALC, TRIG, CHOLHDL, LDLDIRECT in the last 72 hours. Thyroid Function Tests: No results for input(s): TSH, T4TOTAL, FREET4, T3FREE, THYROIDAB in the last 72 hours. Anemia Panel: No results for input(s): VITAMINB12, FOLATE, FERRITIN, TIBC, IRON, RETICCTPCT in the last 72 hours. Urine analysis: No results found for: COLORURINE, APPEARANCEUR, LABSPEC, PHURINE, GLUCOSEU, HGBUR, BILIRUBINUR, KETONESUR, PROTEINUR, UROBILINOGEN, NITRITE, LEUKOCYTESUR  Radiological Exams on Admission: No results found.  EKG: Independently reviewed.  Assessment/Plan Principal Problem:   Pulmonary embolism (HCC) Active Problems:   Acute on chronic systolic CHF (  congestive heart failure) (HCC)   Hypertensive heart disease with heart failure (HCC)   Acute respiratory failure with hypoxia (HCC)   Microcytic hypochromic anemia   DM2 (diabetes mellitus, type 2) ([place])   AKI (acute kidney injury) ([place])    1. PE - 1. Heparin gtt 2. RVSP 48 3. trop 0.41 and drop in EF noted on echo 4. Will get IR consult to see if he might benefit from EKOS 5. Tele monitor 2. Acute on chronic systolic CHF - Acute component appears to be cor pulmonale, chronic component is hypertensive cardiomyopathy 1. B pleural effusions 2. EF drop as noted on echo 3. Will send message to P. Trent to get cards involved given the EF drop, patient normally sees Dr. Bettina Gavia. 4. Given AKI with creat increase: 1. Will leave home lasix at 20mg  PO daily for the moment until cards can eval 2. Will hold entresto 3. Will Get IR consult to see about draining pleural effusion(s) 5. Check BNP in AM 6. BMP daily 3. Acute resp failure with hypoxia - 1. Due to PE + CHF worsening / B pleural effusions 2. On  RA at the moment, initially was requiring 2L 4. DM2 - 1. Hold home hypoglycemics 2. Mod scale SSI AC 5. Microcytic anemia - HGB 9.7 at [facility] 1. Repeat CBC in AM 2. Iron came back low at 36 at [facility] 3. Will start PO iron pills  DVT prophylaxis: Heparin gtt Code Status: Full Family Communication: No family in room Disposition Plan: Home after admit Consults called: Message sent to P. Trent for cards eval in AM, IR consult put into Epic asking about pleural effusion drainage and EKOS. Admission status: Admit to inpatient  Severity of Illness: The appropriate patient status for this patient is INPATIENT. Inpatient status is judged to be reasonable and necessary in order to provide the required intensity of service to ensure the patient's safety. The patient's presenting symptoms, physical exam findings, and initial radiographic and laboratory data in the context of their chronic comorbidities is felt to place them at high risk for further clinical deterioration. Furthermore, it is not anticipated that the patient will be medically stable for discharge from the hospital within 2 midnights of admission. The following factors support the patient status of inpatient.   IP status due to PE, new initial O2 requirement, evidence of R heart strain with LVEF drop, RVSP elevation, and trop elevation.   * I certify that at the point of admission it is my clinical judgment that the patient will require inpatient hospital care spanning beyond 2 midnights from the point of admission due to high intensity of service, high risk for further deterioration and high frequency of surveillance required.*    Laylynn Campanella M. DO [facility]  How to contact the [facility] Attending or Consulting provider [place] or covering provider during after hours [place], for this patient?  1. Check the care team in [facility] and look for a) attending/consulting TR[facility] provider listed and b) the [facility] team listed 2. Log into [email or web address]   Amion Physician Scheduling and messaging for groups and whole hospitals  On call and physician scheduling software for group practices, residents, hospitalists and other medical providers for call, clinic, rotation and shift schedules. OnCall [employer] is a hospital-wide system for scheduling doctors and paging doctors on call. EasyPlot is for scientific plotting and data analysis.  [email or web address]  and use [place]'s universal password to access. If you do not have the password, please contact  the hospital operator.  3. Locate the [facility] provider you are looking for under [facility] and page to a number that you can be directly reached. 4. If you still have difficulty reaching the provider, please page the [facility] (Director on Call) for the Hospitalists listed on amion for assistance.  05/31/2019, 10:15 PM

## 2019-05-31 NOTE — Progress Notes (Signed)
ANTICOAGULATION CONSULT NOTE - Initial Consult  Pharmacy Consult for heparin Indication: pulmonary embolus  Allergies  Allergen Reactions  . Meperidine Nausea And Vomiting    Patient Measurements: Height: 5\' 10"  (177.8 cm) Weight: 134 lb 11.2 oz (61.1 kg) IBW/kg (Calculated) : 73 Heparin Dosing Weight: 61.1 kg   Vital Signs:    Labs: No results for input(s): HGB, HCT, PLT, APTT, LABPROT, INR, HEPARINUNFRC, HEPRLOWMOCWT, CREATININE, CKTOTAL, CKMB, TROPONINIHS in the last 72 hours.  CrCl cannot be calculated (Patient's most recent lab result is older than the maximum 21 days allowed.).   Medical History: Past Medical History:  Diagnosis Date  . Anxiety   . BPH (benign prostatic hyperplasia)   . Depression   . Eczema   . Hyperlipidemia   . Hypertensive heart disease   . Syncope   . Type 2 diabetes mellitus (HCC)     Medications:  Scheduled:  . albuterol  2 puff Inhalation Q6H  . [START ON 06/01/2019] insulin aspart  0-15 Units Subcutaneous TID WC  . insulin aspart  0-5 Units Subcutaneous QHS    Assessment: 55 yom presenting from  [facility]. CTA on 12/25 showing segmental and subsegmental PE in RLL.  Was starting heparin on 12/25 - now on 1100 units/hr (last check at OSH was therapeutic, was last adjusted at 1927). Hgb 9.3, plt WNL. No s/sx of bleeding.   Goal of Therapy:  Heparin level 0.3-0.7 units/ml Monitor platelets by anticoagulation protocol: Yes   Plan:  Continue heparin infusion at 1100 units/hr  Order heparin level on 12/27 at 0200  Monitor heparin level, CBC, and for s/sx of bleeding.  Antonietta Jewel, PharmD, BCCCP Clinical Pharmacist  Phone: [phone]  Please check AMION for all [place] phone numbers After 10:00 PM, call [place] [phone] 05/31/2019,9:43 PM

## 2019-05-31 NOTE — Progress Notes (Addendum)
Trop 461, essentially stable from the 0.41 on [facility] admission labs.  Will up hydralazine PRN dosing from 10mg  Q6H PRN they were using at [facility] to 10-20mg  Q4H PRN here.

## 2019-06-01 ENCOUNTER — Encounter (HOSPITAL_COMMUNITY): Payer: Self-pay | Admitting: Internal Medicine

## 2019-06-01 ENCOUNTER — Inpatient Hospital Stay (HOSPITAL_COMMUNITY): Payer: PPO

## 2019-06-01 ENCOUNTER — Other Ambulatory Visit: Payer: Self-pay

## 2019-06-01 DIAGNOSIS — I2699 Other pulmonary embolism without acute cor pulmonale: Secondary | ICD-10-CM

## 2019-06-01 DIAGNOSIS — J449 Chronic obstructive pulmonary disease, unspecified: Secondary | ICD-10-CM

## 2019-06-01 DIAGNOSIS — J9601 Acute respiratory failure with hypoxia: Secondary | ICD-10-CM

## 2019-06-01 DIAGNOSIS — Z66 Do not resuscitate: Secondary | ICD-10-CM | POA: Diagnosis present

## 2019-06-01 DIAGNOSIS — I5042 Chronic combined systolic (congestive) and diastolic (congestive) heart failure: Secondary | ICD-10-CM

## 2019-06-01 LAB — COMPREHENSIVE METABOLIC PANEL
ALT: 14 U/L (ref 0–44)
AST: 20 U/L (ref 15–41)
Albumin: 3.4 g/dL — ABNORMAL LOW (ref 3.5–5.0)
Alkaline Phosphatase: 68 U/L (ref 38–126)
Anion gap: 11 (ref 5–15)
BUN: 47 mg/dL — ABNORMAL HIGH (ref 8–23)
CO2: 21 mmol/L — ABNORMAL LOW (ref 22–32)
Calcium: 8.4 mg/dL — ABNORMAL LOW (ref 8.9–10.3)
Chloride: 100 mmol/L (ref 98–111)
Creatinine, Ser: 1.46 mg/dL — ABNORMAL HIGH (ref 0.61–1.24)
GFR calc Af Amer: 54 mL/min — ABNORMAL LOW (ref >60)
GFR calc non Af Amer: 46 mL/min — ABNORMAL LOW (ref >60)
Glucose, Bld: 252 mg/dL — ABNORMAL HIGH (ref 70–99)
Potassium: 3.7 mmol/L (ref 3.5–5.1)
Sodium: 132 mmol/L — ABNORMAL LOW (ref 135–145)
Total Bilirubin: 0.5 mg/dL (ref 0.3–1.2)
Total Protein: 6.5 g/dL (ref 6.5–8.1)

## 2019-06-01 LAB — HEPARIN LEVEL (UNFRACTIONATED)
Heparin Unfractionated: 0.23 IU/mL — ABNORMAL LOW (ref 0.30–0.70)
Heparin Unfractionated: 0.46 IU/mL (ref 0.30–0.70)

## 2019-06-01 LAB — CBC WITH DIFFERENTIAL/PLATELET
Abs Immature Granulocytes: 0.05 10*3/uL (ref 0.00–0.07)
Basophils Absolute: 0 10*3/uL (ref 0.0–0.1)
Basophils Relative: 0 %
Eosinophils Absolute: 0 10*3/uL (ref 0.0–0.5)
Eosinophils Relative: 0 %
HCT: 30.5 % — ABNORMAL LOW (ref 39.0–52.0)
Hemoglobin: 9.9 g/dL — ABNORMAL LOW (ref 13.0–17.0)
Immature Granulocytes: 1 %
Lymphocytes Relative: 4 %
Lymphs Abs: 0.3 10*3/uL — ABNORMAL LOW (ref 0.7–4.0)
MCH: 23.8 pg — ABNORMAL LOW (ref 26.0–34.0)
MCHC: 32.5 g/dL (ref 30.0–36.0)
MCV: 73.3 fL — ABNORMAL LOW (ref 80.0–100.0)
Monocytes Absolute: 0.8 10*3/uL (ref 0.1–1.0)
Monocytes Relative: 9 %
Neutro Abs: 8.1 10*3/uL — ABNORMAL HIGH (ref 1.7–7.7)
Neutrophils Relative %: 86 %
Platelets: 252 10*3/uL (ref 150–400)
RBC: 4.16 MIL/uL — ABNORMAL LOW (ref 4.22–5.81)
RDW: 17.8 % — ABNORMAL HIGH (ref 11.5–15.5)
WBC: 9.3 10*3/uL (ref 4.0–10.5)
nRBC: 0 % (ref 0.0–0.2)

## 2019-06-01 LAB — GLUCOSE, CAPILLARY
Glucose-Capillary: 195 mg/dL — ABNORMAL HIGH (ref 70–99)
Glucose-Capillary: 109 mg/dL — ABNORMAL HIGH (ref 70–99)

## 2019-06-01 LAB — TROPONIN I (HIGH SENSITIVITY): Troponin I (High Sensitivity): 463 ng/L (ref <18)

## 2019-06-01 MED ORDER — HEPARIN BOLUS VIA INFUSION
2000.0000 [IU] | Freq: Once | INTRAVENOUS | Status: AC
  Administered 2019-06-01: 2000 [IU] via INTRAVENOUS
  Filled 2019-06-01: qty 2000

## 2019-06-01 MED ORDER — SACUBITRIL-VALSARTAN 49-51 MG PO TABS
1.0000 | ORAL_TABLET | Freq: Two times a day (BID) | ORAL | Status: DC
  Administered 2019-06-01 (×2): 1 via ORAL
  Filled 2019-06-01 (×3): qty 1

## 2019-06-01 MED ORDER — TRAZODONE HCL 50 MG PO TABS
50.0000 mg | ORAL_TABLET | Freq: Every evening | ORAL | Status: DC | PRN
  Administered 2019-06-01 – 2019-06-02 (×2): 50 mg via ORAL
  Filled 2019-06-01 (×2): qty 1

## 2019-06-01 MED ORDER — ENSURE ENLIVE PO LIQD
237.0000 mL | Freq: Two times a day (BID) | ORAL | Status: DC
  Administered 2019-06-01 – 2019-06-02 (×3): 237 mL via ORAL

## 2019-06-01 NOTE — Progress Notes (Signed)
Bilateral lower extremity venous duplex completed. Refer to "CV Proc" under chart review to view preliminary results.  06/01/2019 10:36 AM Kelby Aline., MHA, RVT, RDCS, RDMS

## 2019-06-01 NOTE — Progress Notes (Signed)
ANTICOAGULATION CONSULT NOTE - Follow Up Consult  Pharmacy Consult for heparin Indication: pulmonary embolus  Labs: Recent Labs    05/31/19 2155 06/01/19 0007 06/01/19 0152  HGB  --  9.9*  --   HCT  --  30.5*  --   PLT  --  252  --   HEPARINUNFRC  --   --  0.23*  CREATININE  --  1.46*  --   TROPONINIHS 461* 463*  --     Assessment: 73yo male subtherapeutic on heparin after one level at goal at OSH; no gtt issues or signs of bleeding per RN.  Goal of Therapy:  Heparin level 0.3-0.7 units/ml   Plan:  Will give 2000 units IV bolus x1 and increase heparin gtt by 3 units/kg/hr to 1300 units/hr and check level in 8 hours.    Wynona Neat, PharmD, BCPS  06/01/2019,3:35 AM

## 2019-06-01 NOTE — Progress Notes (Addendum)
Request to IR for thoracentesis as well as consideration for catheter directed PE lysis.  Patient history and imaging has been reviewed by Dr. Kathlene Cote who states that this patient is not a candidate for PE lysis due to minor clot burden in subsegmental lower lobe branches of the RLL only, RV/LV ratio is normal by CTA and evidence of any cor pulmonale by echo is likely due to severe chronic lung disease. Would consider repeating echo here while he is admitted.  Additionally, per CTA performed at [facility] effusions are small in size and do not appear amenable to thoracentesis - however I have placed an order for 2V CXR to further evaluate effusions. Will review CXR once completed and proceed with thoracentesis if effusions appear to be significant.   Please call on call MD with questions or concerns.  Candiss Norse, PA-C  ADDENDUM @ 1143 - 2V CXR resulted and reviewed, tiny bilateral pleural effusions are present which are not amenable to percutaneous drainage. Order will be cancelled at this time - please reorder or call on call IR MD if it is felt this patient would benefit from re-evaluation.

## 2019-06-01 NOTE — Progress Notes (Signed)
[place] for heparin Indication: pulmonary embolus  Allergies  Allergen Reactions  . Meperidine Nausea And Vomiting    Patient Measurements: Height: 5\' 10"  (177.8 cm) Weight: 134 lb 11.2 oz (61.1 kg) IBW/kg (Calculated) : 73 Heparin Dosing Weight: 61.1 kg   Vital Signs: Temp: 97.8 F (36.6 C) (12/27 0829) Temp Source: Oral (12/27 0829) BP: 157/74 (12/27 0829) Pulse Rate: 96 (12/27 0829)  Labs: Recent Labs    05/31/19 2155 06/01/19 0007 06/01/19 0152 06/01/19 1133  HGB  --  9.9*  --   --   HCT  --  30.5*  --   --   PLT  --  252  --   --   HEPARINUNFRC  --   --  0.23* 0.46  CREATININE  --  1.46*  --   --   TROPONINIHS 461* 463*  --   --     Estimated Creatinine Clearance: 37.8 mL/min (A) (by C-G formula based on SCr of 1.46 mg/dL (H)).   Medical History: Past Medical History:  Diagnosis Date  . Anxiety   . BPH (benign prostatic hyperplasia)   . Depression   . Eczema   . Hyperlipidemia   . Hypertensive heart disease   . Syncope   . Type 2 diabetes mellitus (HCC)     Medications:  Scheduled:  . feeding supplement (ENSURE ENLIVE)  237 mL Oral BID BM  . ferrous sulfate  325 mg Oral BID WC  . furosemide  20 mg Oral Daily  . insulin aspart  0-15 Units Subcutaneous TID WC  . insulin aspart  0-5 Units Subcutaneous QHS  . metoprolol succinate  25 mg Oral Daily  . pantoprazole  40 mg Oral Daily  . sacubitril-valsartan  1 tablet Oral BID    Assessment: 25 yom presenting from [facility] on heparin infusion as CTA on 12/25 showing segmental and subsegmental PE in RLL. Pharmacy has been consulted to continue heparin infusion.  Today, heparin level is therapeutic at 0.46 after bolus and rate increase early this morning. Hgb stable, plt WNL and no overt bleeding noted. No issues with the infusion per RN.   Goal of Therapy:  Heparin level 0.3-0.7 units/ml Monitor platelets by anticoagulation protocol: Yes   Plan:   Continue heparin infusion at 1300 units/hr  Obtain 8 hour confirmatory heparin level Monitor daily heparin level, CBC, and for s/sx of bleeding. Follow up transition to oral anticoagulant     Brendolyn Patty, PharmD PGY2 Pharmacy Resident Phone [phone]  06/01/2019   12:34 PM

## 2019-06-01 NOTE — Consult Note (Addendum)
Cardiology Consultation:   Patient ID: Dominic Barber MRN: [id number]; DOB: 31-Jan-1946  Admit date: 05/31/2019 Date of Consult: 06/01/2019  Primary Care Provider: Crist Fat, MD Primary Cardiologist: Norman Herrlich, MD  Primary Electrophysiologist:  None    Patient Profile:   Dominic Barber is a 73 y.o. male with a hx of  who is being seen today for the evaluation of worsening LV dysfunction at the request of Dr. Benjamine Mola.  History of Present Illness:   Dominic Barber is a 73 year old male with past medical history of HTN, HLD, DM 2, hyponatremia and chronic systolic heart failure.  Dominic Barber is being followed by Dr. Dulce Sellar as outpatient.  Based on the previous note, it appears patient has refused hospitalizations at least twice with what was felt to be acute coronary syndrome resulting in heart failure.  Echocardiogram obtained on 04/03/2019 showed EF 35 to 40%, moderate LVH, grade 1 DD, global hypokinesis.  Even at baseline, Dominic Barber has 2 pillow orthopnea.  Dominic Barber went to the ED on 04/09/2019 due to dyspnea and mildly elevated troponin.  ED physician discussed the case with Dr. Dulce Sellar who strongly recommended admission for left and right heart cath.  However patient was adamant Dominic Barber did not want to be admitted.  Dominic Barber was transitioned from lisinopril to valsartan with anticipation of starting on Entresto as outpatient.  Unfortunately Dominic Barber eventually left AGAINST MEDICAL ADVICE.  Patient was last seen by Dr. Dulce Sellar on 05/06/2019 at which time Dominic Barber complained of intermittent shortness of breath at rest and a 2 pillow orthopnea.  Dominic Barber refused to consider further invasive study, however was agreeable to 2-week event monitor to look for sustained VT.  Dominic Barber was also initiated on low-dose Entresto at the time along with metoprolol succinate.  Patient presented to [facility] with increasing shortness of breath at rest.  Initially Dominic Barber complained of 2 days onset of worsening shortness of breath however later Dominic Barber mentioned the  shortness of breath has been worsening for the past 2 weeks.  On arrival, respiratory rate was 20, heart rate in the 90s.  Systolic blood pressure in the 180s to 190s range.  O2 saturation was mid 80s on room air.  4 weeks prior to the ED visit, Dominic Barber was started on Chantix for tobacco cessation.  D-dimer was elevated 2207 (normal range less than 500), troponin 0.41.  Hemoglobin was 9.7.  proBNP 117000.  Sodium 128, potassium 5.1.  Creatinine 1.20.  Rapid COVID-19 test was negative.  Chest x-ray showed no active cardiopulmonary disease.  CT angiogram of the chest showed segmental and subsegmental pulmonary emboli in the right lower lobe and the superior segment of the right lower lobe, moderate bilateral pleural effusion with associated atelectasis, coronary artery disease, aortic atherosclerosis and emphysema.  Dominic Barber was placed on 2 L nasal cannula with improved O2 saturation.  Dominic Barber was started on IV saline and IV heparin.  Ultrasound of lower extremity was negative for DVT.  Echocardiogram performed on 05/30/2019 at [facility] demonstrated EF less than 20%, technically difficult study was suboptimal view as patient was uncooperative, mild to moderate tricuspid regurgitation, normal inferior vena cava with normal inspiratory collapse, no pericardial effusion, RVSP elevated at 48 mmHg.   Heart Pathway Score:     Past Medical History:  Diagnosis Date  . Anxiety   . BPH (benign prostatic hyperplasia)   . Depression   . Eczema   . Hyperlipidemia   . Hypertensive heart disease   . Syncope   . [facility]     Past Surgical History:  Procedure Laterality Date  . COLON SURGERY    . HERNIA REPAIR Bilateral   . SHOULDER SURGERY       Home Medications:  Prior to Admission medications   Medication Sig Start Date End Date Taking? Authorizing Provider  ALPRAZolam Prudy Feeler(XANAX) 0.5 MG tablet Take 0.5 mg by mouth daily as needed.     [provider]  atorvastatin (LIPITOR) 40 MG  tablet Take 40 mg by mouth daily.     [provider]  furosemide (LASIX) 20 MG tablet Take 20 mg by mouth daily. 03/31/19   [provider]  glipiZIDE (GLUCOTROL) 10 MG tablet Take 10 mg by mouth 2 (two) times daily before a meal.     [provider]  HYDROcodone-acetaminophen (NORCO/VICODIN) 5-325 MG tablet Take 1 tablet by mouth every 4 (four) hours as needed. 01/23/19   [provider]  metFORMIN (GLUCOPHAGE) 1000 MG tablet Take 1,000 mg by mouth 2 (two) times daily.     [provider]  metoprolol succinate (TOPROL-XL) 25 MG 24 hr tablet Take 0.5 tablets (12.5 mg total) by mouth daily. 05/06/19   Baldo DaubMunley, Brian J, MD  nitroGLYCERIN (NITROSTAT) 0.4 MG SL tablet Place 1 tablet (0.4 mg total) under the tongue every 5 (five) minutes as needed for chest pain. If you take more than 1 tablet you need to come to the emergency department. 04/09/19   Petrucelli, Samantha R, PA-C  pantoprazole (PROTONIX) 40 MG tablet Take 40 mg by mouth daily. 03/07/19   [provider]  sacubitril-valsartan (ENTRESTO) 24-26 MG Take 1 tablet by mouth 2 (two) times daily. 05/06/19   Baldo DaubMunley, Brian J, MD  sodium zirconium cyclosilicate (LOKELMA) 10 g PACK packet Take 10 g by mouth 3 (three) times a week. Take on Monday, Wednesday, and Friday only. 05/14/19   Baldo DaubMunley, Brian J, MD  tamsulosin (FLOMAX) 0.4 MG CAPS capsule Take 0.4 mg by mouth daily.     [provider]  venlafaxine XR (EFFEXOR XR) 75 MG 24 hr capsule Take 75 mg by mouth daily.     [provider]  lisinopril (PRINIVIL,ZESTRIL) 40 MG tablet Take 40 mg by mouth daily.   04/09/19  [provider]    Inpatient Medications: Scheduled Meds: . feeding supplement (ENSURE ENLIVE)  237 mL Oral BID BM  . ferrous sulfate  325 mg Oral BID WC  . furosemide  20 mg Oral Daily  . insulin aspart  0-15 Units Subcutaneous TID WC  . insulin aspart  0-5 Units Subcutaneous QHS  . metoprolol succinate  25 mg  Oral Daily  . pantoprazole  40 mg Oral Daily   Continuous Infusions: . heparin 1,300 Units/hr (06/01/19 0346)   PRN Meds: acetaminophen, albuterol, hydrALAZINE, ondansetron **OR** ondansetron (ZOFRAN) IV, zolpidem  Allergies:    Allergies  Allergen Reactions  . Meperidine Nausea And Vomiting    Social History:   Social History   Socioeconomic History  . Marital status: Married    Spouse name: Not on file  . Number of children: Not on file  . Years of education: Not on file  . Highest education level: Not on file  Occupational History  . Not on file  Tobacco Use  . Smoking status: Current Every Day Smoker    Packs/day: 1.00    Years: 50.00    Pack years: 50.00    Types: Cigarettes  . Smokeless tobacco: Never Used  Substance and Sexual Activity  .  Alcohol use: Never    Alcohol/week: 0.0 standard drinks  . Drug use: Never  . Sexual activity: Not on file  Other Topics Concern  . Not on file  Social History Narrative  . Not on file   Social Determinants of Health   Financial Resource Strain:   . Difficulty of Paying Living Expenses: Not on file  Food Insecurity:   . Worried About Programme researcher, broadcasting/film/video in the Last Year: Not on file  . Ran Out of Food in the Last Year: Not on file  Transportation Needs:   . Lack of Transportation (Medical): Not on file  . Lack of Transportation (Non-Medical): Not on file  Physical Activity:   . Days of Exercise per Week: Not on file  . Minutes of Exercise per Session: Not on file  Stress:   . Feeling of Stress : Not on file  Social Connections:   . Frequency of Communication with Friends and Family: Not on file  . Frequency of Social Gatherings with Friends and Family: Not on file  . Attends Religious Services: Not on file  . Active Member of Clubs or Organizations: Not on file  . Attends Banker Meetings: Not on file  . Marital Status: Not on file  Intimate Partner Violence:   . Fear of Current or Ex-Partner: Not  on file  . Emotionally Abused: Not on file  . Physically Abused: Not on file  . Sexually Abused: Not on file    Family History:    Family History  Problem Relation Age of Onset  . Heart attack Neg Hx   . Stroke Neg Hx   . Diabetes Neg Hx      ROS:  Please see the history of present illness.   All other ROS reviewed and negative.     Physical Exam/Data:   Vitals:   05/31/19 2100 05/31/19 2120 06/01/19 0551  BP:  (!) 175/84 (!) 179/93  Pulse:  82 90  Resp:  (!) 21 20  Temp:  98.4 F (36.9 C) 98.3 F (36.8 C)  TempSrc:  Oral Oral  Weight: 61.1 kg    Height:  (1.778 m)      Intake/Output Summary (Last 24 hours) at 06/01/2019 4098 Last data filed at 06/01/2019 0500 Gross per 24 hour  Intake 70.67 ml  Output 550 ml  Net -479.33 ml   Last 3 Weights 05/31/2019 05/06/2019 04/09/2019  Weight (lbs) 134 lb 11.2 oz 138 lb 6.4 oz 143 lb  Weight (kg) 61.1 kg 62.778 kg 64.864 kg     Body mass index is 19.33 kg/m.  General:  Chronically ill appearing, cachectic HEENT: normal Lymph: no adenopathy Neck: no JVD Endocrine:  No thryomegaly Vascular: No carotid bruits; FA pulses 2+ bilaterally without bruits  Cardiac:  normal S1, S2; RRR; no murmur  Lungs:  clear to auscultation bilaterally, no wheezing rales. Mild intermittent rhonchi Abd: soft, nontender, no hepatomegaly  Ext: no edema Musculoskeletal:  No deformities, BUE and BLE strength normal and equal Skin: warm and dry  Neuro:  CNs 2-12 intact, no focal abnormalities noted Psych:  Normal affect   EKG:  The EKG was personally reviewed and demonstrates: EKG reviewed from [facility], showed sinus tachycardia, heart rate of 105, poor R wave progression in the anterior leads, T wave inversion in V4 and V5. Telemetry:  Telemetry was personally reviewed and demonstrates: Sinus tachycardia, 2 episodes of nonsustained VT.  Relevant CV Studies:  Echo 04/03/2019 IMPRESSIONS  1. Left ventricular ejection  fraction, by visual estimation, is 35 to 40%. The left ventricle has moderate to severely decreased function. Left ventricular septal wall thickness was moderately increased. Moderately increased left ventricular posterior  wall thickness. There is moderately increased left ventricular hypertrophy.  2. Left ventricular diastolic parameters are consistent with Grade I diastolic dysfunction (impaired relaxation).  3. The left ventricle demonstrates global hypokinesis.  4. Global right ventricle has normal systolic function.The right ventricular size is normal. No increase in right ventricular wall thickness.  5. Left atrial size was normal.  6. Right atrial size was normal.  7. The mitral valve is normal in structure. No evidence of mitral valve regurgitation. No evidence of mitral stenosis.  8. The tricuspid valve is normal in structure. Tricuspid valve regurgitation is not demonstrated.  9. The aortic valve is tricuspid. Aortic valve regurgitation is not visualized. No evidence of aortic valve sclerosis or stenosis. 10. The pulmonic valve was normal in structure. Pulmonic valve regurgitation is not visualized. 11. The inferior vena cava is normal in size with greater than 50% respiratory variability, suggesting right atrial pressure of 3 mmHg.   Laboratory Data:  High Sensitivity Troponin:   Recent Labs  Lab 05/31/19 2155 06/01/19 0007  TROPONINIHS 461* 463*     Chemistry Recent Labs  Lab 06/01/19 0007  NA 132*  K 3.7  CL 100  CO2 21*  GLUCOSE 252*  BUN 47*  CREATININE 1.46*  CALCIUM 8.4*  GFRNONAA 46*  GFRAA 54*  ANIONGAP 11    Recent Labs  Lab 06/01/19 0007  PROT 6.5  ALBUMIN 3.4*  AST 20  ALT 14  ALKPHOS 68  BILITOT 0.5   Hematology Recent Labs  Lab 06/01/19 0007  WBC 9.3  RBC 4.16*  HGB 9.9*  HCT 30.5*  MCV 73.3*  MCH 23.8*  MCHC 32.5  RDW 17.8*  PLT 252   BNP Recent Labs  Lab 05/31/19 2154  BNP >4,500.0*    DDimer No results for input(s):  DDIMER in the last 168 hours.   Radiology/Studies:  LONG TERM MONITOR (3-14 DAYS)  Result Date: 05/31/2019 A ZIO monitor was performed for 14 days beginning 05/06/2019 to assess ventricular arrhythmia in the setting of presumed coronary artery disease heart failure and severely reduced ejection fraction. The rhythm throughout was sinus with minimum average and maximum heart rates of 5174 and 134 bpm There were no pauses of 3 seconds or greater and no episodes of high degree AV nodal or sinus node exit block. Ventricular ectopy was rare with PVCs 174 couplets and 5 triplets.  There were brief runs of ventricular tachycardia the longest 8 complexes at a rate of 156 bpm. Supraventricular ectopy was rare without episodes of atrial fibrillation or flutter.  Atrial premature contractions were present longest 14 plexus at a rate of 161 bpm. There were 2 triggered events 1 associated with a ventricular couplet and 1 associated with an isolated atrial premature contraction. Conclusion: Although overall ventricular ectopy is rare there were couplets triplets and brief runs of ventricular tachycardia noted.  {  Assessment and Plan:   1. Segmental PE  -Dominic Barber is currently on IV heparin.  Can consider switch to Xarelto prior to discharge.  Xarelto PE protocol is 50 mg twice daily for first 21 days then 20 mg daily thereafter.  Once daily dosing of Xarelto will offer better compliance in this patient.  Dominic Barber will need at least 3 to 6 months of anticoagulation therapy.  2. Bilateral pleural  effusion: Despite the presence of bilateral pleural effusion, Dominic Barber does not have any lower extremity edema, Dominic Barber does not appear to be significantly volume overloaded either.  I would not recommend aggressively diuresing this patient due to concern of decreasing his preload during PE which may worsen his symptoms.  3. Chronic respiratory failure: Dominic Barber has two-pillow orthopnea and intermittent shortness of breath even at rest.  I am not  confident that his shortness of breath is the result of chronic systolic heart failure, I suspect his pulmonary issue including emphysema is also contributing to his symptoms.  4. Worsening LV dysfunction: Based on recent CT, Dominic Barber likely has severe underlying coronary artery disease.  Dr. Dulce Sellar has discussed with the patient several times regarding cardiac catheterization, Dominic Barber has adamantly refused.  I discussed with the patient again, Dominic Barber does not want any invasive study.  5. Emphysema: seen on recent CT obtained at [facility].  6. Hypertension: Blood pressure elevated in the 170s to 180s, will increase Entresto to 49-51 mg twice daily  7. Hyperlipidemia: On Lipitor  8. DM2: Hemoglobin A1c at [facility] was 5.9.  Sliding scale insulin.  9. AKI: Creatinine 1.4.  Currently on home Lasix of 20 mg daily.  May consider holding the Lasix for a few days.  10. Nonsustained VT: Dominic Barber had acute episode of nonsustained VT on the telemetry.  One episode was 7 beats, second episode was 8 beats.  Dominic Barber recently wore a heart monitor that showed the longest run of nonsustained VT was 8 beats.  We will continue low-dose beta-blocker.  11. Elevated troponin: Likely demand ischemia.  Serial troponin flat.  Patient denies any chest pain.  We did discuss possibility of the invasive study in the future, Dominic Barber adamantly refused again.      For questions or updates, please contact [facility] Please consult [email or web address] for contact info under     Signed, Azalee Course, PA  06/01/2019 8:22 AM   I have seen and examined the patient along with Azalee Course, PA .  I have reviewed the chart, notes and new data.  I agree with PA's note.  Key new complaints: Dominic Barber is not very communicative.  Answers most questions with single syllable answers. Dominic Barber has lost >10 lb this year. Key examination changes: Very slender, borderline cachectic.  Severely diminished breath sounds throughout without active wheezes or rales.  No  pleural rubs are heard.  Normal cardiovascular exam.  No edema or tenderness in either calf.  Blood pressure is not low, on the contrary Dominic Barber is hypertensive.  No signs of cardiogenic shock.  Not requiring supplemental oxygen at this time. Key new findings / data: Imaging studies consistent with emphysema, small (segmental/subsegmental) pulmonary emboli in the right upper and lower lobes, bilateral pleural effusions.  Echocardiogram shows severely depressed LVEF at 20%, even lower than in the past (these images are not available for review and the study was described as being of poor technical quality).  Infrequent and brief nonsustained VT on monitor.  Remains hyponatremic, but improving with diuresis.  Creatinine relatively stable.  Systolic PA pressure of 48 mmHg may be related to chronic cor pulmonale from emphysema and/or acute cor pulmonale from pulmonary embolism.  PLAN: 1. PE: Dominic Barber does not appear acutely ill and is not in shock.  Invasive treatment for his pulmonary embolism is not indicated. Transition from IV heparin to direct oral anticoagulant. 2. CHF (chr, syst+diast): Dominic Barber has evidence of severe cardiomyopathy, most likely ischemic. No edema or orthopnea, clinically  euvolemic.  "Dry weight" uncertain as Dominic Barber is quickly losing "real" weight. Severely elevated proBNP (no baseline available) in setting of acute PE. I agree with Dr. Bettina Gavia that Dominic Barber requires cardiac catheterization, but this should be delayed until Dominic Barber has received treatment for his pulmonary believes him for several weeks.  Regardless, the patient continues to refuse cardiac catheterization. Dominic Barber is hypertensive. Increase Entresto dose (started about 4 weeks ago). Continue low dose beta blocker. 3. COPD: suspect that Dominic Barber will have residual dyspnea even when CHF is optimally treated. Prognosis is poor.  Sanda Klein, MD, [place] [phone] 06/01/2019, 9:29 AM

## 2019-06-01 NOTE — Progress Notes (Signed)
Progress Note    Ancil Dewan  CBU:[id number] DOB: 05-27-1946  DOA: 05/31/2019 PCP: Crist Fat, MD    Brief Narrative:     Medical records reviewed and are as summarized below:  Dominic Barber is an 73 y.o. male Nicolaus Andel is a 73 y.o. male with medical history significant of HTN cardiomyopathy CHF with EF 35-40% in Oct, DM2, HTN. Patient presented to ED at [facility] on 12/25 with 2 day h/o SOB.  No CP.  No prior h/o blood clot, no fevers / chills.  No recent long distance travel or surgeries. Patient was COVID negative, CTA revealed acute segmental PEs  Assessment/Plan:   Principal Problem:   Pulmonary embolism (HCC) Active Problems:   Acute on chronic systolic CHF (congestive heart failure) (HCC)   Hypertensive heart disease with heart failure (HCC)   Acute respiratory failure with hypoxia (HCC)   Microcytic hypochromic anemia   DM2 (diabetes mellitus, type 2) (HCC)   AKI (acute kidney injury) (HCC)   DNR (do not resuscitate)   1. Acute pulmonary emboli 1. Heparin gtt 2. TOC consult for eliquis vs xarelto 2.  chronic systolic CHF  1. Small B/l pleural effusions-- repeat x ray stable and patient appears dry 2. EF drop as noted on echo (down to 25%) 3. Cardiology consult appreciated: Dr. Salena Saner: I agree with Dr. Dulce Sellar that he requires cardiac catheterization, but this should be delayed until he has received treatment for his pulmonary believes him for several weeks.  Regardless, the patient continues to refuse cardiac catheterization. He is hypertensive. Increase Entresto dose (started about 4 weeks ago). Continue low dose beta blocker. 3. Acute resp failure with hypoxia - 1. Due to PE + CHF worsening / B pleural effusions 2. On RA at the moment, initially was requiring 2L 4. DM2 - 1. Hold home hypoglycemics 2. Mod scale SSI AC 5. Microcytic anemia - HGB 9.7 at [facility] 1. Repeat CBC in AM 2. Iron came back low at 36 at [facility] 3. PO iron  Overall poor prognosis-- will get  palliative care consult for GOC   Family Communication/Anticipated D/C date and plan/Code Status   DVT prophylaxis: heparin Code Status: dnr Family Communication:  Disposition Plan: pending change to NOAC   Medical Consultants:    Cardiology  IR    Subjective:   Did not sleep well last night  Objective:    Vitals:   05/31/19 2100 05/31/19 2120 06/01/19 0551 06/01/19 0829  BP:  (!) 175/84 (!) 179/93 (!) 157/74  Pulse:  82 90 96  Resp:  (!) 21 20 20   Temp:  98.4 F (36.9 C) 98.3 F (36.8 C) 97.8 F (36.6 C)  TempSrc:  Oral Oral Oral  SpO2:    98%  Weight: 61.1 kg     Height: 5\' 10"  (1.778 m)       Intake/Output Summary (Last 24 hours) at 06/01/2019 1222 Last data filed at 06/01/2019 1000 Gross per 24 hour  Intake 70.67 ml  Output 700 ml  Net -629.33 ml   Filed Weights   05/31/19 2100  Weight: 61.1 kg    Exam: In bed, chronically ill appearing rrr No wheezing, no increased work of breathing +BS, soft, NT  Data Reviewed:   I have personally reviewed following labs and imaging studies:  Labs: Labs show the following:   Basic Metabolic Panel: Recent Labs  Lab 06/01/19 0007  NA 132*  K 3.7  CL 100  CO2 21*  GLUCOSE 252*  BUN 47*  CREATININE 1.46*  CALCIUM 8.4*   GFR Estimated Creatinine Clearance: 37.8 mL/min (A) (by C-G formula based on SCr of 1.46 mg/dL (H)). Liver Function Tests: Recent Labs  Lab 06/01/19 0007  AST 20  ALT 14  ALKPHOS 68  BILITOT 0.5  PROT 6.5  ALBUMIN 3.4*   No results for input(s): LIPASE, AMYLASE in the last 168 hours. No results for input(s): AMMONIA in the last 168 hours. Coagulation profile No results for input(s): INR, PROTIME in the last 168 hours.  CBC: Recent Labs  Lab 06/01/19 0007  WBC 9.3  NEUTROABS 8.1*  HGB 9.9*  HCT 30.5*  MCV 73.3*  PLT 252   Cardiac Enzymes: No results for input(s): CKTOTAL, CKMB, CKMBINDEX, TROPONINI in the last 168 hours. BNP (last 3 results) Recent Labs     04/08/19 1143  PROBNP 31,084*   CBG: Recent Labs  Lab 05/31/19 2321 06/01/19 0748  GLUCAP 268* 195*   D-Dimer: No results for input(s): DDIMER in the last 72 hours. Hgb A1c: Recent Labs    05/31/19 2140  HGBA1C 6.1*   Lipid Profile: No results for input(s): CHOL, HDL, LDLCALC, TRIG, CHOLHDL, LDLDIRECT in the last 72 hours. Thyroid function studies: No results for input(s): TSH, T4TOTAL, T3FREE, THYROIDAB in the last 72 hours.  Invalid input(s): FREET3 Anemia work up: No results for input(s): VITAMINB12, FOLATE, FERRITIN, TIBC, IRON, RETICCTPCT in the last 72 hours. Sepsis Labs: Recent Labs  Lab 06/01/19 0007  WBC 9.3    Microbiology No results found for this or any previous visit (from the past 240 hour(s)).  Procedures and diagnostic studies:  DG Chest 2 View  Result Date: 06/01/2019 CLINICAL DATA:  Pulmonary embolism and bilateral pleural effusions by prior CTA on 05/30/2019 EXAM: CHEST - 2 VIEW COMPARISON:  CTA of the chest and chest x-ray on 05/30/2019 at [facility] FINDINGS: The heart size and mediastinal contours are within normal limits. No significant pleural effusions identified with tiny bilateral posterior effusions seen only on the lateral view. Stable chronic lung disease and mild hyperinflation without evidence of focal consolidation, pulmonary edema or pneumothorax. The visualized skeletal structures are unremarkable. IMPRESSION: Tiny bilateral posterior pleural effusions seen only on the lateral view. Stable chronic lung disease. Electronically Signed   By: Irish Lack M.D.   On: 06/01/2019 11:39   VAS [place] LOWER EXTREMITY VENOUS (DVT)  Result Date: 06/01/2019  Lower Venous Study Indications: Pulmonary embolism.  Limitations: Body habitus and restricted mobility. Comparison Study: No prior study. Performing Technologist: Gertie Fey MHA, RDMS, RVT, RDCS  Examination Guidelines: A complete evaluation includes B-mode imaging, spectral  Doppler, color Doppler, and power Doppler as needed of all accessible portions of each vessel. Bilateral testing is considered an integral part of a complete examination. Limited examinations for reoccurring indications may be performed as noted.  +---------+---------------+---------+-----------+----------+--------------+ RIGHT    CompressibilityPhasicitySpontaneityPropertiesThrombus Aging +---------+---------------+---------+-----------+----------+--------------+ CFV      Full           Yes      Yes                                 +---------+---------------+---------+-----------+----------+--------------+ SFJ      Full                                                        +---------+---------------+---------+-----------+----------+--------------+  FV Prox  Full                                                        +---------+---------------+---------+-----------+----------+--------------+ FV Mid   Full                                                        +---------+---------------+---------+-----------+----------+--------------+ FV DistalFull                                                        +---------+---------------+---------+-----------+----------+--------------+ PFV      Full                                                        +---------+---------------+---------+-----------+----------+--------------+ POP      Full           Yes      Yes                                 +---------+---------------+---------+-----------+----------+--------------+ PTV      Full                                                        +---------+---------------+---------+-----------+----------+--------------+ PERO     Full                                                        +---------+---------------+---------+-----------+----------+--------------+   +---------+---------------+---------+-----------+----------+--------------+ LEFT      CompressibilityPhasicitySpontaneityPropertiesThrombus Aging +---------+---------------+---------+-----------+----------+--------------+ CFV      Full           Yes      Yes                                 +---------+---------------+---------+-----------+----------+--------------+ SFJ      Full                                                        +---------+---------------+---------+-----------+----------+--------------+ FV Prox  Full                                                        +---------+---------------+---------+-----------+----------+--------------+  FV Mid   Full                                                        +---------+---------------+---------+-----------+----------+--------------+ FV DistalFull                                                        +---------+---------------+---------+-----------+----------+--------------+ PFV      Full                                                        +---------+---------------+---------+-----------+----------+--------------+ POP      Full           Yes      Yes                                 +---------+---------------+---------+-----------+----------+--------------+ PTV      Full                                                        +---------+---------------+---------+-----------+----------+--------------+ PERO     Full                                                        +---------+---------------+---------+-----------+----------+--------------+  Summary: Right: There is no evidence of deep vein thrombosis in the lower extremity. No cystic structure found in the popliteal fossa. Left: There is no evidence of deep vein thrombosis in the lower extremity. No cystic structure found in the popliteal fossa.  *See table(s) above for measurements and observations.    Preliminary     Medications:   . feeding supplement (ENSURE ENLIVE)  237 mL Oral BID BM  . ferrous sulfate  325  mg Oral BID WC  . furosemide  20 mg Oral Daily  . insulin aspart  0-15 Units Subcutaneous TID WC  . insulin aspart  0-5 Units Subcutaneous QHS  . metoprolol succinate  25 mg Oral Daily  . pantoprazole  40 mg Oral Daily  . sacubitril-valsartan  1 tablet Oral BID   Continuous Infusions: . heparin 1,300 Units/hr (06/01/19 0346)     LOS: 1 day   Joseph ArtJessica U Miana Politte  [facility]   How to contact the [facility] Attending or Consulting provider 7A - 7P or covering provider during after hours 7P -7A, for this patient?  1. Check the care team in [facility] and look for a) attending/consulting [facility] provider listed and b) the [facility] team listed 2. Log into [email or web address] and use [facility]'s universal password to access. If you do not have the password, please contact the hospital operator. 3. Locate the  [place] provider you are looking for under [facility] and page to a number that you can be directly reached. 4. If you still have difficulty reaching the provider, please page the [facility] (Director on Call) for the Hospitalists listed on amion for assistance.  06/01/2019, 12:22 PM

## 2019-06-01 NOTE — Progress Notes (Signed)
RN witnessed conversation with the patient, his wife and his daughter. Wife told the patient that he was going to get better, he replied "I hope so but if I don't just let me die." This made his wife a daughter visibly upset and his daughter asked "is that what you want me to tell my children that their grandfather just wants to die?" RN requested palliative consult for goals of care discussion since the patient seems to be on a different page from his family.

## 2019-06-02 DIAGNOSIS — I5023 Acute on chronic systolic (congestive) heart failure: Secondary | ICD-10-CM

## 2019-06-02 DIAGNOSIS — I2609 Other pulmonary embolism with acute cor pulmonale: Secondary | ICD-10-CM

## 2019-06-02 DIAGNOSIS — I110 Hypertensive heart disease with heart failure: Secondary | ICD-10-CM

## 2019-06-02 LAB — CBC WITH DIFFERENTIAL/PLATELET
Abs Immature Granulocytes: 0.04 10*3/uL (ref 0.00–0.07)
Basophils Absolute: 0 10*3/uL (ref 0.0–0.1)
Basophils Relative: 0 %
Eosinophils Absolute: 0 10*3/uL (ref 0.0–0.5)
Eosinophils Relative: 0 %
HCT: 28 % — ABNORMAL LOW (ref 39.0–52.0)
Hemoglobin: 9.4 g/dL — ABNORMAL LOW (ref 13.0–17.0)
Immature Granulocytes: 0 %
Lymphocytes Relative: 13 %
Lymphs Abs: 1.2 10*3/uL (ref 0.7–4.0)
MCH: 24.3 pg — ABNORMAL LOW (ref 26.0–34.0)
MCHC: 33.6 g/dL (ref 30.0–36.0)
MCV: 72.4 fL — ABNORMAL LOW (ref 80.0–100.0)
Monocytes Absolute: 1 10*3/uL (ref 0.1–1.0)
Monocytes Relative: 11 %
Neutro Abs: 6.9 10*3/uL (ref 1.7–7.7)
Neutrophils Relative %: 76 %
Platelets: 206 10*3/uL (ref 150–400)
RBC: 3.87 MIL/uL — ABNORMAL LOW (ref 4.22–5.81)
RDW: 18.2 % — ABNORMAL HIGH (ref 11.5–15.5)
WBC: 9.1 10*3/uL (ref 4.0–10.5)
nRBC: 0 % (ref 0.0–0.2)

## 2019-06-02 LAB — COMPREHENSIVE METABOLIC PANEL
ALT: 14 U/L (ref 0–44)
AST: 20 U/L (ref 15–41)
Albumin: 3.2 g/dL — ABNORMAL LOW (ref 3.5–5.0)
Alkaline Phosphatase: 57 U/L (ref 38–126)
Anion gap: 12 (ref 5–15)
BUN: 41 mg/dL — ABNORMAL HIGH (ref 8–23)
CO2: 22 mmol/L (ref 22–32)
Calcium: 8.4 mg/dL — ABNORMAL LOW (ref 8.9–10.3)
Chloride: 101 mmol/L (ref 98–111)
Creatinine, Ser: 1.25 mg/dL — ABNORMAL HIGH (ref 0.61–1.24)
GFR calc Af Amer: >60 mL/min (ref >60)
GFR calc non Af Amer: 56 mL/min — ABNORMAL LOW (ref >60)
Glucose, Bld: 142 mg/dL — ABNORMAL HIGH (ref 70–99)
Potassium: 3.4 mmol/L — ABNORMAL LOW (ref 3.5–5.1)
Sodium: 135 mmol/L (ref 135–145)
Total Bilirubin: 1 mg/dL (ref 0.3–1.2)
Total Protein: 6 g/dL — ABNORMAL LOW (ref 6.5–8.1)

## 2019-06-02 LAB — GLUCOSE, CAPILLARY
Glucose-Capillary: 159 mg/dL — ABNORMAL HIGH (ref 70–99)
Glucose-Capillary: 254 mg/dL — ABNORMAL HIGH (ref 70–99)
Glucose-Capillary: 239 mg/dL — ABNORMAL HIGH (ref 70–99)
Glucose-Capillary: 176 mg/dL — ABNORMAL HIGH (ref 70–99)

## 2019-06-02 LAB — HEPARIN LEVEL (UNFRACTIONATED): Heparin Unfractionated: 0.56 IU/mL (ref 0.30–0.70)

## 2019-06-02 MED ORDER — APIXABAN 5 MG PO TABS: 5.0000 mg | ORAL_TABLET | Freq: Two times a day (BID) | ORAL | Status: DC

## 2019-06-02 MED ORDER — ARIPIPRAZOLE 5 MG PO TABS
5.0000 mg | ORAL_TABLET | Freq: Every day | ORAL | Status: DC
  Administered 2019-06-02 – 2019-06-03 (×2): 5 mg via ORAL
  Filled 2019-06-02 (×2): qty 1

## 2019-06-02 MED ORDER — ALBUTEROL SULFATE (2.5 MG/3ML) 0.083% IN NEBU
2.5000 mg | INHALATION_SOLUTION | Freq: Once | RESPIRATORY_TRACT | Status: AC
  Administered 2019-06-02: 2.5 mg via RESPIRATORY_TRACT
  Filled 2019-06-02: qty 3

## 2019-06-02 MED ORDER — APIXABAN 5 MG PO TABS
10.0000 mg | ORAL_TABLET | Freq: Two times a day (BID) | ORAL | Status: DC
  Administered 2019-06-02 – 2019-06-03 (×2): 10 mg via ORAL
  Filled 2019-06-02 (×2): qty 2

## 2019-06-02 MED ORDER — VENLAFAXINE HCL ER 75 MG PO CP24
75.0000 mg | ORAL_CAPSULE | Freq: Every day | ORAL | Status: DC
  Administered 2019-06-02 – 2019-06-03 (×2): 75 mg via ORAL
  Filled 2019-06-02 (×2): qty 1

## 2019-06-02 MED ORDER — POTASSIUM CHLORIDE 20 MEQ/15ML (10%) PO SOLN
20.0000 meq | Freq: Once | ORAL | Status: AC
  Administered 2019-06-02: 20 meq via ORAL
  Filled 2019-06-02: qty 15

## 2019-06-02 MED ORDER — SACUBITRIL-VALSARTAN 97-103 MG PO TABS
1.0000 | ORAL_TABLET | Freq: Two times a day (BID) | ORAL | Status: DC
  Administered 2019-06-02 – 2019-06-03 (×3): 1 via ORAL
  Filled 2019-06-02 (×4): qty 1

## 2019-06-02 MED ORDER — GLIPIZIDE 10 MG PO TABS
10.0000 mg | ORAL_TABLET | Freq: Two times a day (BID) | ORAL | Status: DC
  Administered 2019-06-02: 10 mg via ORAL
  Filled 2019-06-02: qty 1

## 2019-06-02 MED ORDER — TAMSULOSIN HCL 0.4 MG PO CAPS
0.4000 mg | ORAL_CAPSULE | Freq: Every day | ORAL | Status: DC
  Administered 2019-06-02 – 2019-06-03 (×2): 0.4 mg via ORAL
  Filled 2019-06-02 (×2): qty 1

## 2019-06-02 MED ORDER — HYDROCODONE-ACETAMINOPHEN 5-325 MG PO TABS: 1.0000 | ORAL_TABLET | ORAL | Status: DC | PRN

## 2019-06-02 MED ORDER — ALPRAZOLAM 0.5 MG PO TABS
0.5000 mg | ORAL_TABLET | Freq: Every day | ORAL | Status: DC | PRN
  Administered 2019-06-02: 0.5 mg via ORAL
  Filled 2019-06-02: qty 1

## 2019-06-02 MED ORDER — ATORVASTATIN CALCIUM 40 MG PO TABS
40.0000 mg | ORAL_TABLET | Freq: Every day | ORAL | Status: DC
  Administered 2019-06-02: 40 mg via ORAL
  Filled 2019-06-02: qty 1

## 2019-06-02 NOTE — Progress Notes (Signed)
Primary Cardiologist:  Dulce Sellar and Croitoru  Subjective:  Dyspnea BP elevated this am   Objective:  Vitals:   06/01/19 1806 06/01/19 2149 06/02/19 0743 06/02/19 0757  BP: (!) 158/89 (!) 157/72  (!) 197/100  Pulse: 89 80  83  Resp: 19 14  16   Temp: 98.3 F (36.8 C) 97.9 F (36.6 C)  98.6 F (37 C)  TempSrc: Oral   Oral  SpO2: 100% 100% 98% 100%  Weight:      Height:        Intake/Output from previous day:  Intake/Output Summary (Last 24 hours) at 06/02/2019 0805 Last data filed at 06/02/2019 06/04/2019 Gross per 24 hour  Intake 143 ml  Output 1125 ml  Net -982 ml    Physical Exam: Affect appropriate Elderly thin male  HEENT: normal Neck supple with no adenopathy JVP normal no bruits no thyromegaly Lungs clear with no wheezing and good diaphragmatic motion Heart:  S1/S2 no murmur, no rub, gallop or click PMI normal Abdomen: benighn, BS positve, no tenderness, no AAA no bruit.  No HSM or HJR Distal pulses intact with no bruits No edema Neuro non-focal Skin warm and dry No muscular weakness   Lab Results: Basic Metabolic Panel: Recent Labs    06/01/19 0007 06/02/19 0114  NA 132* 135  K 3.7 3.4*  CL 100 101  CO2 21* 22  GLUCOSE 252* 142*  BUN 47* 41*  CREATININE 1.46* 1.25*  CALCIUM 8.4* 8.4*   Liver Function Tests: Recent Labs    06/01/19 0007 06/02/19 0114  AST 20 20  ALT 14 14  ALKPHOS 68 57  BILITOT 0.5 1.0  PROT 6.5 6.0*  ALBUMIN 3.4* 3.2*   No results for input(s): LIPASE, AMYLASE in the last 72 hours. CBC: Recent Labs    06/01/19 0007 06/02/19 0114  WBC 9.3 9.1  NEUTROABS 8.1* 6.9  HGB 9.9* 9.4*  HCT 30.5* 28.0*  MCV 73.3* 72.4*  PLT 252 206   Hemoglobin A1C: Recent Labs    05/31/19 2140  HGBA1C 6.1*    Imaging: DG Chest 2 View  Result Date: 06/01/2019 CLINICAL DATA:  Pulmonary embolism and bilateral pleural effusions by prior CTA on 05/30/2019 EXAM: CHEST - 2 VIEW COMPARISON:  CTA of the chest and chest x-ray on  05/30/2019 at [facility] FINDINGS: The heart size and mediastinal contours are within normal limits. No significant pleural effusions identified with tiny bilateral posterior effusions seen only on the lateral view. Stable chronic lung disease and mild hyperinflation without evidence of focal consolidation, pulmonary edema or pneumothorax. The visualized skeletal structures are unremarkable. IMPRESSION: Tiny bilateral posterior pleural effusions seen only on the lateral view. Stable chronic lung disease. Electronically Signed   By: [facility] M.D.   On: 06/01/2019 11:39   VAS 06/03/2019 LOWER EXTREMITY VENOUS (DVT)  Result Date: 06/01/2019  Lower Venous Study Indications: Pulmonary embolism.  Limitations: Body habitus and restricted mobility. Comparison Study: No prior study. Performing Technologist: 06/03/2019 MHA, RDMS, RVT, RDCS  Examination Guidelines: A complete evaluation includes B-mode imaging, spectral Doppler, color Doppler, and power Doppler as needed of all accessible portions of each vessel. Bilateral testing is considered an integral part of a complete examination. Limited examinations for reoccurring indications may be performed as noted.  +---------+---------------+---------+-----------+----------+--------------+ RIGHT    CompressibilityPhasicitySpontaneityPropertiesThrombus Aging +---------+---------------+---------+-----------+----------+--------------+ CFV      Full           Yes      Yes                                 +---------+---------------+---------+-----------+----------+--------------+  SFJ      Full                                                        +---------+---------------+---------+-----------+----------+--------------+ FV Prox  Full                                                        +---------+---------------+---------+-----------+----------+--------------+ FV Mid   Full                                                         +---------+---------------+---------+-----------+----------+--------------+ FV DistalFull                                                        +---------+---------------+---------+-----------+----------+--------------+ PFV      Full                                                        +---------+---------------+---------+-----------+----------+--------------+ POP      Full           Yes      Yes                                 +---------+---------------+---------+-----------+----------+--------------+ PTV      Full                                                        +---------+---------------+---------+-----------+----------+--------------+ PERO     Full                                                        +---------+---------------+---------+-----------+----------+--------------+   +---------+---------------+---------+-----------+----------+--------------+ LEFT     CompressibilityPhasicitySpontaneityPropertiesThrombus Aging +---------+---------------+---------+-----------+----------+--------------+ CFV      Full           Yes      Yes                                 +---------+---------------+---------+-----------+----------+--------------+ SFJ      Full                                                        +---------+---------------+---------+-----------+----------+--------------+  FV Prox  Full                                                        +---------+---------------+---------+-----------+----------+--------------+ FV Mid   Full                                                        +---------+---------------+---------+-----------+----------+--------------+ FV DistalFull                                                        +---------+---------------+---------+-----------+----------+--------------+ PFV      Full                                                         +---------+---------------+---------+-----------+----------+--------------+ POP      Full           Yes      Yes                                 +---------+---------------+---------+-----------+----------+--------------+ PTV      Full                                                        +---------+---------------+---------+-----------+----------+--------------+ PERO     Full                                                        +---------+---------------+---------+-----------+----------+--------------+  Summary: Right: There is no evidence of deep vein thrombosis in the lower extremity. No cystic structure found in the popliteal fossa. Left: There is no evidence of deep vein thrombosis in the lower extremity. No cystic structure found in the popliteal fossa.  *See table(s) above for measurements and observations. Electronically signed by Monica Martinez MD on 06/01/2019 at 1:48:42 PM.    Final     Cardiac Studies:  ECG: SR anterolateral T wave changes    Telemetry:  NSR 06/02/2019   Echo: EF 35-40% 03/2919   Medications:   . feeding supplement (ENSURE ENLIVE)  237 mL Oral BID BM  . ferrous sulfate  325 mg Oral BID WC  . furosemide  20 mg Oral Daily  . insulin aspart  0-15 Units Subcutaneous TID WC  . insulin aspart  0-5 Units Subcutaneous QHS  . metoprolol succinate  25 mg Oral Daily  . pantoprazole  40 mg Oral Daily  . sacubitril-valsartan  1 tablet Oral BID     .  heparin 1,300 Units/hr (06/01/19 1655)    Assessment/Plan:   1. PE:  Per primary service change to oral xarelto ? Today No evidence of LE DVT 2. CHF:  Chronic systolic increase entresto due to high BP not volume overloated continue lopressor and current lasix dose He refuses invasive evaluation with undoubtedly ischemic DCM Worrisome anterolateral T wave changes suggesting LAD disease. Palliative to see patient depressed DNR 3. COPD:  1 ppd smoker since teens will likely need to go home on oxygen CXR  yesterday with tiny posterior pleural effusions and chronic lung disease   Dominic Barber Dominic Barber 06/02/2019, 8:05 AM

## 2019-06-02 NOTE — Progress Notes (Signed)
[facility] for heparin Indication: pulmonary embolus  Allergies  Allergen Reactions  . Meperidine Nausea And Vomiting    Patient Measurements: Height: 5\' 10"  (177.8 cm) Weight: 134 lb 11.2 oz (61.1 kg) IBW/kg (Calculated) : 73 Heparin Dosing Weight: 61.1 kg   Vital Signs: Temp: 97.9 F (36.6 C) (12/27 2149) Temp Source: Oral (12/27 1806) BP: 157/72 (12/27 2149) Pulse Rate: 80 (12/27 2149)  Labs: Recent Labs    05/31/19 2155 06/01/19 0007 06/01/19 0152 06/01/19 1133 06/02/19 0114  HGB  --  9.9*  --   --  9.4*  HCT  --  30.5*  --   --  28.0*  PLT  --  252  --   --  206  HEPARINUNFRC  --   --  0.23* 0.46 0.56  CREATININE  --  1.46*  --   --  1.25*  TROPONINIHS 461* 463*  --   --   --     Estimated Creatinine Clearance: 44.1 mL/min (A) (by C-G formula based on SCr of 1.25 mg/dL (H)).   Assessment: 69 yom presenting from [facility] on heparin infusion as CTA on 12/25 showing segmental and subsegmental PE in RLL. Pharmacy has been consulted to continue heparin infusion.  Heparin level remains therapeutic (0.56) on gtt at 1300 units/hr. No bleeding noted.  Goal of Therapy:  Heparin level 0.3-0.7 units/ml Monitor platelets by anticoagulation protocol: Yes   Plan:  Continue heparin infusion at 1300 units/hr  Monitor daily heparin level - next 12/29, CBC, and for s/sx of bleeding. Follow up transition to oral anticoagulant   Sherlon Handing, PharmD, BCPS Please see amion for complete clinical pharmacist phone list  06/02/2019   1:54 AM

## 2019-06-02 NOTE — Progress Notes (Signed)
SATURATION QUALIFICATIONS: (This note is used to comply with regulatory documentation for home oxygen)  Patient Saturations on Room Air at Rest = 100%  Patient Saturations on Room Air while Ambulating = 94%  Patient Saturations on 0 Liters of oxygen while Ambulating = 94%  Please briefly explain why patient needs home oxygen:patient able to maintain oxygen saturation level greater than 94% while ambulating 150 feet including transfers from and to bed.

## 2019-06-02 NOTE — Progress Notes (Signed)
Progress Note    Dominic Barber  WVP:[id number] DOB: November 24, 1945  DOA: 05/31/2019 PCP: Crist Fat, MD    Brief Narrative:     Medical records reviewed and are as summarized below:  Dominic Barber is an 73 y.o. male Dominic Barber is a 73 y.o. male with medical history significant of HTN cardiomyopathy CHF with EF 35-40% in Oct, DM2, HTN. Patient presented to ED at [facility] on 12/25 with 2 day h/o SOB.  No CP.  No prior h/o blood clot, no fevers / chills.  No recent long distance travel or surgeries. Patient was COVID negative, CTA revealed acute segmental PEs  Assessment/Plan:   Principal Problem:   Pulmonary embolism (HCC) Active Problems:   Acute on chronic systolic CHF (congestive heart failure) (HCC)   Hypertensive heart disease with heart failure (HCC)   Acute respiratory failure with hypoxia (HCC)   Microcytic hypochromic anemia   DM2 (diabetes mellitus, type 2) (HCC)   AKI (acute kidney injury) (HCC)   DNR (do not resuscitate)   1. Acute pulmonary emboli 1. Heparin gtt- transition to eliquis 2.  chronic systolic CHF  1. Small B/l pleural effusions-- repeat x-ray stable and patient appears dry 2. EF drop as noted on echo (down to 25%) 3. Cardiology consult appreciated: Dr. Salena Saner: I agree with Dr. Dulce Sellar that he requires cardiac catheterization, but this should be delayed until he has received treatment for his pulmonary believes him for several weeks.  Regardless, the patient continues to refuse cardiac catheterization. He is hypertensive. Increase Entresto dose (started about 4 weeks ago). Continue low dose beta blocker. 3. Acute resp failure with hypoxia - 1. Due to PE + CHF worsening / B pleural effusions 2. Was able to be weaned to RA 4. DM2 - 1. Mod scale SSI AC 5. Microcytic anemia - HGB 9.7 at [facility] 1. Iron came back low at 36 at [facility] 2. PO iron  Overall poor prognosis-- will get palliative care consult for GOC-- per nursing:RN witnessed conversation with the  patient, his wife and his daughter. Wife told the patient that he was going to get better, he replied "I hope so but if I don't just let me die." This made his wife a daughter visibly upset and his daughter asked "is that what you want me to tell my children that their grandfather just wants to die?"    Family Communication/Anticipated D/C date and plan/Code Status   DVT prophylaxis: heparin Code Status: dnr Family Communication:  Disposition Plan: home in the AM   Medical Consultants:    Cardiology  IR    Subjective:   Tired-- "leave me alone"  Objective:    Vitals:   06/02/19 0757 06/02/19 0825 06/02/19 0830 06/02/19 0900  BP: (!) 197/100 (!) 176/83 (!) 151/57 (!) 146/51  Pulse: 83     Resp: 16     Temp: 98.6 F (37 C)     TempSrc: Oral     SpO2: 100%     Weight:      Height:        Intake/Output Summary (Last 24 hours) at 06/02/2019 1606 Last data filed at 06/02/2019 1500 Gross per 24 hour  Intake 240 ml  Output 1125 ml  Net -885 ml   Filed Weights   05/31/19 2100  Weight: 61.1 kg    Exam: In bed, unengaged Will answer with a few words rrr Lungs diminished but no crackles/no wheezing No LE edema  Data Reviewed:   I  have personally reviewed following labs and imaging studies:  Labs: Labs show the following:   Basic Metabolic Panel: Recent Labs  Lab 06/01/19 0007 06/02/19 0114  NA 132* 135  K 3.7 3.4*  CL 100 101  CO2 21* 22  GLUCOSE 252* 142*  BUN 47* 41*  CREATININE 1.46* 1.25*  CALCIUM 8.4* 8.4*   GFR Estimated Creatinine Clearance: 44.1 mL/min (A) (by C-G formula based on SCr of 1.25 mg/dL (H)). Liver Function Tests: Recent Labs  Lab 06/01/19 0007 06/02/19 0114  AST 20 20  ALT 14 14  ALKPHOS 68 57  BILITOT 0.5 1.0  PROT 6.5 6.0*  ALBUMIN 3.4* 3.2*   No results for input(s): LIPASE, AMYLASE in the last 168 hours. No results for input(s): AMMONIA in the last 168 hours. Coagulation profile No results for input(s): INR,  PROTIME in the last 168 hours.  CBC: Recent Labs  Lab 06/01/19 0007 06/02/19 0114  WBC 9.3 9.1  NEUTROABS 8.1* 6.9  HGB 9.9* 9.4*  HCT 30.5* 28.0*  MCV 73.3* 72.4*  PLT 252 206   Cardiac Enzymes: No results for input(s): CKTOTAL, CKMB, CKMBINDEX, TROPONINI in the last 168 hours. BNP (last 3 results) Recent Labs    04/08/19 1143  PROBNP 31,084*   CBG: Recent Labs  Lab 05/31/19 2321 06/01/19 0748 06/01/19 2037 06/02/19 0759 06/02/19 1257  GLUCAP 268* 195* 109* 159* 254*   D-Dimer: No results for input(s): DDIMER in the last 72 hours. Hgb A1c: Recent Labs    05/31/19 2140  HGBA1C 6.1*   Lipid Profile: No results for input(s): CHOL, HDL, LDLCALC, TRIG, CHOLHDL, LDLDIRECT in the last 72 hours. Thyroid function studies: No results for input(s): TSH, T4TOTAL, T3FREE, THYROIDAB in the last 72 hours.  Invalid input(s): FREET3 Anemia work up: No results for input(s): VITAMINB12, FOLATE, FERRITIN, TIBC, IRON, RETICCTPCT in the last 72 hours. Sepsis Labs: Recent Labs  Lab 06/01/19 0007 06/02/19 0114  WBC 9.3 9.1    Microbiology No results found for this or any previous visit (from the past 240 hour(s)).  Procedures and diagnostic studies:  DG Chest 2 View  Result Date: 06/01/2019 CLINICAL DATA:  Pulmonary embolism and bilateral pleural effusions by prior CTA on 05/30/2019 EXAM: CHEST - 2 VIEW COMPARISON:  [facility] the chest [facility] FINDINGS: The heart size and mediastinal contours are within normal limits. No significant pleural effusions identified with tiny bilateral posterior effusions seen only on the lateral view. Stable chronic lung disease and mild hyperinflation without evidence of focal consolidation, pulmonary edema or pneumothorax. The visualized skeletal structures are unremarkable. IMPRESSION: Tiny bilateral posterior pleural effusions seen only on the lateral view. Stable chronic lung disease. Electronically  Signed   By: Irish LackGlenn  Yamagata M.D.   On: 06/01/2019 11:39   VAS US LOWER EXTREMITY VENOUS (DVT)  Result Date: 06/01/2019  Lower Venous Study Indications: Pulmonary embolism.  Limitations: Body habitus and restricted mobility. Comparison Study: No prior study. Performing Technologist: Gertie FeyMichelle Simonetti MHA, RDMS, RVT, RDCS  Examination Guidelines: A complete evaluation includes B-mode imaging, spectral Doppler, color Doppler, and power Doppler as needed of all accessible portions of each vessel. Bilateral testing is considered an integral part of a complete examination. Limited examinations for reoccurring indications may be performed as noted.  +---------+---------------+---------+-----------+----------+--------------+ RIGHT    CompressibilityPhasicitySpontaneityPropertiesThrombus Aging +---------+---------------+---------+-----------+----------+--------------+ CFV      Full           Yes      Yes                                 +---------+---------------+---------+-----------+----------+--------------+  SFJ      Full                                                        +---------+---------------+---------+-----------+----------+--------------+ FV Prox  Full                                                        +---------+---------------+---------+-----------+----------+--------------+ FV Mid   Full                                                        +---------+---------------+---------+-----------+----------+--------------+ FV DistalFull                                                        +---------+---------------+---------+-----------+----------+--------------+ PFV      Full                                                        +---------+---------------+---------+-----------+----------+--------------+ POP      Full           Yes      Yes                                 +---------+---------------+---------+-----------+----------+--------------+  PTV      Full                                                        +---------+---------------+---------+-----------+----------+--------------+ PERO     Full                                                        +---------+---------------+---------+-----------+----------+--------------+   +---------+---------------+---------+-----------+----------+--------------+ LEFT     CompressibilityPhasicitySpontaneityPropertiesThrombus Aging +---------+---------------+---------+-----------+----------+--------------+ CFV      Full           Yes      Yes                                 +---------+---------------+---------+-----------+----------+--------------+ SFJ      Full                                                        +---------+---------------+---------+-----------+----------+--------------+  FV Prox  Full                                                        +---------+---------------+---------+-----------+----------+--------------+ FV Mid   Full                                                        +---------+---------------+---------+-----------+----------+--------------+ FV DistalFull                                                        +---------+---------------+---------+-----------+----------+--------------+ PFV      Full                                                        +---------+---------------+---------+-----------+----------+--------------+ POP      Full           Yes      Yes                                 +---------+---------------+---------+-----------+----------+--------------+ PTV      Full                                                        +---------+---------------+---------+-----------+----------+--------------+ PERO     Full                                                        +---------+---------------+---------+-----------+----------+--------------+  Summary: Right: There is no evidence of deep  vein thrombosis in the lower extremity. No cystic structure found in the popliteal fossa. Left: There is no evidence of deep vein thrombosis in the lower extremity. No cystic structure found in the popliteal fossa.  *See table(s) above for measurements and observations. Electronically signed by Sherald Hess MD on 06/01/2019 at 1:48:42 PM.    Final     Medications:   . apixaban  10 mg Oral BID   Followed by  . [START ON 06/09/2019] apixaban  5 mg Oral BID  . ARIPiprazole  5 mg Oral Daily  . feeding supplement (ENSURE ENLIVE)  237 mL Oral BID BM  . ferrous sulfate  325 mg Oral BID WC  . furosemide  20 mg Oral Daily  . insulin aspart  0-15 Units Subcutaneous TID WC  . insulin aspart  0-5 Units Subcutaneous QHS  . metoprolol succinate  25 mg Oral Daily  . pantoprazole  40 mg Oral Daily  . sacubitril-valsartan  1  tablet Oral BID  . tamsulosin  0.4 mg Oral Daily  . venlafaxine XR  75 mg Oral Daily   Continuous Infusions: . heparin 1,300 Units/hr (06/02/19 1129)     LOS: 2 days   Geradine Girt  [facility]   How to contact the [facility] Attending or Consulting provider [place] or covering provider during after hours [place], for this patient?  1. Check the care team in [facility] and look for a) attending/consulting [facility] provider listed and b) the [facility] team listed 2. Log into [email or web address] and use [place]'s universal password to access. If you do not have the password, please contact the hospital operator. 3. Locate the [facility] provider you are looking for under [facility] and page to a number that you can be directly reached. 4. If you still have difficulty reaching the provider, please page the [facility] (Director on Call) for the Hospitalists listed on amion for assistance.  06/02/2019, 4:06 PM

## 2019-06-02 NOTE — Progress Notes (Signed)
[place] for apixaban Indication: pulmonary embolus  Allergies  Allergen Reactions  . Meperidine Nausea And Vomiting    Patient Measurements: Height: 5\' 10"  (177.8 cm) Weight: 134 lb 11.2 oz (61.1 kg) IBW/kg (Calculated) : 73 Heparin Dosing Weight: 61.1 kg   Vital Signs: Temp: 98.6 F (37 C) (12/28 0757) Temp Source: Oral (12/28 0757) BP: 146/51 (12/28 0900) Pulse Rate: 83 (12/28 0757)  Labs: Recent Labs    05/31/19 2155 06/01/19 0007 06/01/19 0152 06/01/19 1133 06/02/19 0114  HGB  --  9.9*  --   --  9.4*  HCT  --  30.5*  --   --  28.0*  PLT  --  252  --   --  206  HEPARINUNFRC  --   --  0.23* 0.46 0.56  CREATININE  --  1.46*  --   --  1.25*  TROPONINIHS 461* 463*  --   --   --     Estimated Creatinine Clearance: 44.1 mL/min (A) (by C-G formula based on SCr of 1.25 mg/dL (H)).   Assessment: 73 yom presenting from [facility] on heparin infusion as CTA on 12/25 showing segmental and subsegmental PE in RLL. Pharmacy has been consulted for apixaban dosing.  73 years old, 61.1 kg and Scr of 1.25 - should receive full dose.   Plan:  Apixaban 10 mg po bid x 7 days, then 5 mg po bid D/c heparin drip with first dose of apixaban Monitor for signs and symptoms of bleeding  Alanda Slim, PharmD, [facility] Clinical Pharmacist Please see AMION for all Pharmacists' Contact Phone Numbers 06/02/2019, 1:00 PM

## 2019-06-03 ENCOUNTER — Telehealth: Payer: Self-pay | Admitting: Emergency Medicine

## 2019-06-03 LAB — CBC WITH DIFFERENTIAL/PLATELET
Abs Immature Granulocytes: 0.03 10*3/uL (ref 0.00–0.07)
Basophils Absolute: 0 10*3/uL (ref 0.0–0.1)
Basophils Relative: 0 %
Eosinophils Absolute: 0 10*3/uL (ref 0.0–0.5)
Eosinophils Relative: 0 %
HCT: 28 % — ABNORMAL LOW (ref 39.0–52.0)
Hemoglobin: 9.1 g/dL — ABNORMAL LOW (ref 13.0–17.0)
Immature Granulocytes: 0 %
Lymphocytes Relative: 19 %
Lymphs Abs: 1.6 10*3/uL (ref 0.7–4.0)
MCH: 24.3 pg — ABNORMAL LOW (ref 26.0–34.0)
MCHC: 32.5 g/dL (ref 30.0–36.0)
MCV: 74.7 fL — ABNORMAL LOW (ref 80.0–100.0)
Monocytes Absolute: 0.8 10*3/uL (ref 0.1–1.0)
Monocytes Relative: 10 %
Neutro Abs: 5.7 10*3/uL (ref 1.7–7.7)
Neutrophils Relative %: 71 %
Platelets: 224 10*3/uL (ref 150–400)
RBC: 3.75 MIL/uL — ABNORMAL LOW (ref 4.22–5.81)
RDW: 18.9 % — ABNORMAL HIGH (ref 11.5–15.5)
WBC: 8.1 10*3/uL (ref 4.0–10.5)
nRBC: 0 % (ref 0.0–0.2)

## 2019-06-03 LAB — COMPREHENSIVE METABOLIC PANEL
ALT: 14 U/L (ref 0–44)
AST: 17 U/L (ref 15–41)
Albumin: 2.8 g/dL — ABNORMAL LOW (ref 3.5–5.0)
Alkaline Phosphatase: 54 U/L (ref 38–126)
Anion gap: 10 (ref 5–15)
BUN: 37 mg/dL — ABNORMAL HIGH (ref 8–23)
CO2: 26 mmol/L (ref 22–32)
Calcium: 8.3 mg/dL — ABNORMAL LOW (ref 8.9–10.3)
Chloride: 99 mmol/L (ref 98–111)
Creatinine, Ser: 1.23 mg/dL (ref 0.61–1.24)
GFR calc Af Amer: >60 mL/min (ref >60)
GFR calc non Af Amer: 57 mL/min — ABNORMAL LOW (ref >60)
Glucose, Bld: 56 mg/dL — ABNORMAL LOW (ref 70–99)
Potassium: 3.5 mmol/L (ref 3.5–5.1)
Sodium: 135 mmol/L (ref 135–145)
Total Bilirubin: 1.1 mg/dL (ref 0.3–1.2)
Total Protein: 5.5 g/dL — ABNORMAL LOW (ref 6.5–8.1)

## 2019-06-03 LAB — GLUCOSE, CAPILLARY
Glucose-Capillary: 65 mg/dL — ABNORMAL LOW (ref 70–99)
Glucose-Capillary: 102 mg/dL — ABNORMAL HIGH (ref 70–99)
Glucose-Capillary: 150 mg/dL — ABNORMAL HIGH (ref 70–99)

## 2019-06-03 MED ORDER — ELIQUIS DVT/PE STARTER PACK 5 MG PO TBPK: ORAL_TABLET | ORAL | 0 refills | Status: DC

## 2019-06-03 MED ORDER — SACUBITRIL-VALSARTAN 97-103 MG PO TABS: 1.0000 | ORAL_TABLET | Freq: Two times a day (BID) | ORAL | 0 refills | Status: DC

## 2019-06-03 MED ORDER — ARIPIPRAZOLE 5 MG PO TABS: 5.0000 mg | ORAL_TABLET | Freq: Every day | ORAL | Status: AC

## 2019-06-03 MED ORDER — METOPROLOL SUCCINATE ER 25 MG PO TB24: 25.0000 mg | ORAL_TABLET | Freq: Every day | ORAL | 0 refills | Status: AC

## 2019-06-03 MED ORDER — FERROUS SULFATE 325 (65 FE) MG PO TABS: 325.0000 mg | ORAL_TABLET | Freq: Two times a day (BID) | ORAL | 0 refills | Status: AC

## 2019-06-03 MED FILL — ELIQUIS STARTER PACK 5 MG T: 5 | 30 days supply | Qty: 74 | Fill #0

## 2019-06-03 NOTE — Progress Notes (Signed)
Patient was stable at discharge. I removed his IV. We reviewed the discharge education. Patient and daughter verbalized understanding and had no further questions. Patient left with eliquis prescription in hand along with all of his belongings.

## 2019-06-03 NOTE — Telephone Encounter (Signed)
Left message for patient to return call regarding test results.  

## 2019-06-03 NOTE — Plan of Care (Signed)
Care plan goals met. Pt adequate for discharge.  

## 2019-06-03 NOTE — Telephone Encounter (Signed)
Patient's wife is returning phone call.  °

## 2019-06-03 NOTE — Discharge Summary (Signed)
Physician Discharge Summary  Dominic Barber YCX:[id number] DOB: 03-22-1946 DOA: 05/31/2019  PCP: Townsend Roger, MD  Admit date: 05/31/2019 Discharge date: 06/03/2019  Admitted From: home Discharge disposition: home   Recommendations for Outpatient Follow-Up:   1. Referral to outpatient palliative care 2. BMP/CBC 1 week 3. eliquis started for PE 4. entresto increased   Discharge Diagnosis:   Principal Problem:   Pulmonary embolism ([place]) Active Problems:   Acute on chronic systolic CHF (congestive heart failure) (HCC)   Hypertensive heart disease with heart failure (HCC)   Acute respiratory failure with hypoxia (HCC)   Microcytic hypochromic anemia   DM2 (diabetes mellitus, type 2) (HCC)   AKI (acute kidney injury) ([place])   DNR (do not resuscitate)    Discharge Condition: Improved.  Diet recommendation: Low sodium, heart healthy.  Carbohydrate-modified  Wound care: None.  Code status: DNR.   History of Present Illness:   Dominic Barber is a 73 y.o. male with medical history significant of HTN cardiomyopathy CHF with EF 35-40% in Oct, DM2, HTN.  Patient presented to ED at [facility] on 12/25 with 2 day h/o SOB.  No CP.  No prior h/o blood clot, no fevers / chills.  No recent long distance travel or surgeries.  Patient was COVID negative, CTA revealed acute segmental PEs, concern for R heart strain given Trop of 0.41.  CT also revealed moderate B pleural effusions.  Creat was 1.2, rose to 1.49 today.  BP was elevated, he was continued on metoprolol (actually looks like this was increased to 25mg  daily) but entresto was held.  He was put on hydralazine IV PRN for BP control.  Heparin gtt was started.  2d echo done today revealed EF was down to <20% and RVSP was 48.  Patient has been transferred to [facility].  Currently: patient breathing well at rest, no O2 requirement at rest.  BP 497W systolic.   Hospital Course by Problem:   1. Acute pulmonary  emboli 1. Heparin gtt- transition to eliquis 2.  chronic systolic CHF  1. Small B/l pleural effusions-- repeat x-ray stable and patient appears dry 2. EF drop as noted on echo (down to 25%) 3. Cardiology consult appreciated: Dr. Loletha Grayer: I agree with Dr. Bettina Gavia that he requires cardiac catheterization, but this should be delayed until he has received treatment for his pulmonary believes him for several weeks. Regardless, the patient continues to refuse cardiac catheterization. He is hypertensive. Increase Entresto dose (started about 4 weeks ago). Continue low dose beta blocker. 3. Acute resp failure with hypoxia - 1. Due to PE + CHF worsening / B pleural effusions 2. Was able to be weaned to RA 4. DM2 - 1. Mod scale SSI AC 5. Microcytic anemia - HGB 9.7 at [facility] 1. Iron came back low at 36 at [facility] 2. PO iron 3. Outpatient follow up    Medical Consultants:   cardiology   Discharge Exam:   Vitals:   06/02/19 2114 06/03/19 0833  BP: (!) 135/56 (!) 144/71  Pulse: 90 67  Resp: 18   Temp: 98.2 F (36.8 C) 97.8 F (36.6 C)  SpO2: 100% 97%   Vitals:   06/02/19 1808 06/02/19 1837 06/02/19 2114 06/03/19 0833  BP: (!) 172/79 (!) 142/66 (!) 135/56 (!) 144/71  Pulse: 91  90 67  Resp: 16  18   Temp: 98.5 F (36.9 C)  98.2 F (36.8 C) 97.8 F (36.6 C)  TempSrc: Oral  Oral Oral  SpO2: 98%  100% 97%  Weight:      Height:        General exam: Appears calm and comfortable. More interactive today and less withdrawn   The results of significant diagnostics from this hospitalization (including imaging, microbiology, ancillary and laboratory) are listed below for reference.     Procedures and Diagnostic Studies:   DG Chest 2 View  Result Date: 06/01/2019 CLINICAL DATA:  Pulmonary embolism and bilateral pleural effusions by prior CTA on 05/30/2019 EXAM: CHEST - 2 VIEW COMPARISON:  CTA of the chest and chest x-ray on 05/30/2019 at [facility] FINDINGS: The heart size and mediastinal  contours are within normal limits. No significant pleural effusions identified with tiny bilateral posterior effusions seen only on the lateral view. Stable chronic lung disease and mild hyperinflation without evidence of focal consolidation, pulmonary edema or pneumothorax. The visualized skeletal structures are unremarkable. IMPRESSION: Tiny bilateral posterior pleural effusions seen only on the lateral view. Stable chronic lung disease. Electronically Signed   By: Irish Lack M.D.   On: 06/01/2019 11:39   VAS [place] LOWER EXTREMITY VENOUS (DVT)  Result Date: 06/01/2019  Lower Venous Study Indications: Pulmonary embolism.  Limitations: Body habitus and restricted mobility. Comparison Study: No prior study. Performing Technologist: Gertie Fey MHA, RDMS, RVT, RDCS  Examination Guidelines: A complete evaluation includes B-mode imaging, spectral Doppler, color Doppler, and power Doppler as needed of all accessible portions of each vessel. Bilateral testing is considered an integral part of a complete examination. Limited examinations for reoccurring indications may be performed as noted.  +---------+---------------+---------+-----------+----------+--------------+ RIGHT    CompressibilityPhasicitySpontaneityPropertiesThrombus Aging +---------+---------------+---------+-----------+----------+--------------+ CFV      Full           Yes      Yes                                 +---------+---------------+---------+-----------+----------+--------------+ SFJ      Full                                                        +---------+---------------+---------+-----------+----------+--------------+ FV Prox  Full                                                        +---------+---------------+---------+-----------+----------+--------------+ FV Mid   Full                                                         +---------+---------------+---------+-----------+----------+--------------+ FV DistalFull                                                        +---------+---------------+---------+-----------+----------+--------------+ PFV      Full                                                        +---------+---------------+---------+-----------+----------+--------------+  POP      Full           Yes      Yes                                 +---------+---------------+---------+-----------+----------+--------------+ PTV      Full                                                        +---------+---------------+---------+-----------+----------+--------------+ PERO     Full                                                        +---------+---------------+---------+-----------+----------+--------------+   +---------+---------------+---------+-----------+----------+--------------+ LEFT     CompressibilityPhasicitySpontaneityPropertiesThrombus Aging +---------+---------------+---------+-----------+----------+--------------+ CFV      Full           Yes      Yes                                 +---------+---------------+---------+-----------+----------+--------------+ SFJ      Full                                                        +---------+---------------+---------+-----------+----------+--------------+ FV Prox  Full                                                        +---------+---------------+---------+-----------+----------+--------------+ FV Mid   Full                                                        +---------+---------------+---------+-----------+----------+--------------+ FV DistalFull                                                        +---------+---------------+---------+-----------+----------+--------------+ PFV      Full                                                         +---------+---------------+---------+-----------+----------+--------------+ POP      Full           Yes      Yes                                 +---------+---------------+---------+-----------+----------+--------------+  PTV      Full                                                        +---------+---------------+---------+-----------+----------+--------------+ PERO     Full                                                        +---------+---------------+---------+-----------+----------+--------------+  Summary: Right: There is no evidence of deep vein thrombosis in the lower extremity. No cystic structure found in the popliteal fossa. Left: There is no evidence of deep vein thrombosis in the lower extremity. No cystic structure found in the popliteal fossa.  *See table(s) above for measurements and observations. Electronically signed by Sherald Hesshristopher Clark MD on 06/01/2019 at 1:48:42 PM.    Final      Labs:   Basic Metabolic Panel: Recent Labs  Lab 06/01/19 0007 06/02/19 0114 06/03/19 0358  NA 132* 135 135  K 3.7 3.4* 3.5  CL 100 101 99  CO2 21* 22 26  GLUCOSE 252* 142* 56*  BUN 47* 41* 37*  CREATININE 1.46* 1.25* 1.23  CALCIUM 8.4* 8.4* 8.3*   GFR Estimated Creatinine Clearance: 44.8 mL/min (by C-G formula based on SCr of 1.23 mg/dL). Liver Function Tests: Recent Labs  Lab 06/01/19 0007 06/02/19 0114 06/03/19 0358  AST 20 20 17   ALT 14 14 14   ALKPHOS 68 57 54  BILITOT 0.5 1.0 1.1  PROT 6.5 6.0* 5.5*  ALBUMIN 3.4* 3.2* 2.8*   No results for input(s): LIPASE, AMYLASE in the last 168 hours. No results for input(s): AMMONIA in the last 168 hours. Coagulation profile No results for input(s): INR, PROTIME in the last 168 hours.  CBC: Recent Labs  Lab 06/01/19 0007 06/02/19 0114 06/03/19 0358  WBC 9.3 9.1 8.1  NEUTROABS 8.1* 6.9 5.7  HGB 9.9* 9.4* 9.1*  HCT 30.5* 28.0* 28.0*  MCV 73.3* 72.4* 74.7*  PLT 252 206 224   Cardiac Enzymes: No results  for input(s): CKTOTAL, CKMB, CKMBINDEX, TROPONINI in the last 168 hours. BNP: Invalid input(s): POCBNP CBG: Recent Labs  Lab 06/02/19 1257 06/02/19 1815 06/02/19 2036 06/03/19 0628 06/03/19 0835  GLUCAP 254* 239* 176* 65* 102*   D-Dimer No results for input(s): DDIMER in the last 72 hours. Hgb A1c Recent Labs    05/31/19 2140  HGBA1C 6.1*   Lipid Profile No results for input(s): CHOL, HDL, LDLCALC, TRIG, CHOLHDL, LDLDIRECT in the last 72 hours. Thyroid function studies No results for input(s): TSH, T4TOTAL, T3FREE, THYROIDAB in the last 72 hours.  Invalid input(s): FREET3 Anemia work up No results for input(s): VITAMINB12, FOLATE, FERRITIN, TIBC, IRON, RETICCTPCT in the last 72 hours. Microbiology No results found for this or any previous visit (from the past 240 hour(s)).   Discharge Instructions:   Discharge Instructions    (HEART FAILURE PATIENTS) Call MD:  Anytime you have any of the following symptoms: 1) 3 pound weight gain in 24 hours or 5 pounds in 1 week 2) shortness of breath, with or without a dry hacking cough 3) swelling in the hands, feet or stomach 4) if you have to sleep on extra pillows at  night in order to breathe.   Complete by: As directed    Diet - low sodium heart healthy   Complete by: As directed    Diet Carb Modified   Complete by: As directed    Increase activity slowly   Complete by: As directed      Allergies as of 06/03/2019      Reactions   Meperidine Nausea And Vomiting      Medication List    STOP taking these medications   sacubitril-valsartan 24-26 MG Commonly known as: ENTRESTO Replaced by: sacubitril-valsartan 97-103 MG     TAKE these medications   ALPRAZolam 0.5 MG tablet Commonly known as: XANAX Take 0.5 mg by mouth daily as needed.   ARIPiprazole 5 MG tablet Commonly known as: ABILIFY Take 1 tablet (5 mg total) by mouth daily. Start taking on: June 04, 2019   atorvastatin 40 MG tablet Commonly known as:  LIPITOR Take 40 mg by mouth daily.   Effexor XR 75 MG 24 hr capsule Generic drug: venlafaxine XR Take 75 mg by mouth daily.   Eliquis DVT/PE Starter Pack 5 MG Tbpk Generic drug: Apixaban Starter Pack Take as directed on package: start with two-5mg  tablets twice daily for 7 days. On day 8, switch to one-5mg  tablet twice daily.   ferrous sulfate 325 (65 FE) MG tablet Take 1 tablet (325 mg total) by mouth 2 (two) times daily with a meal.   furosemide 20 MG tablet Commonly known as: LASIX Take 20 mg by mouth daily.   glipiZIDE 10 MG tablet Commonly known as: GLUCOTROL Take 10 mg by mouth 2 (two) times daily before a meal.   HYDROcodone-acetaminophen 5-325 MG tablet Commonly known as: NORCO/VICODIN Take 1 tablet by mouth every 4 (four) hours as needed.   metFORMIN 1000 MG tablet Commonly known as: GLUCOPHAGE Take 1,000 mg by mouth 2 (two) times daily.   metoprolol succinate 25 MG 24 hr tablet Commonly known as: TOPROL-XL Take 1 tablet (25 mg total) by mouth daily. Start taking on: June 04, 2019 What changed: how much to take   nitroGLYCERIN 0.4 MG SL tablet Commonly known as: NITROSTAT Place 1 tablet (0.4 mg total) under the tongue every 5 (five) minutes as needed for chest pain. If you take more than 1 tablet you need to come to the emergency department.   pantoprazole 40 MG tablet Commonly known as: PROTONIX Take 40 mg by mouth daily.   sacubitril-valsartan 97-103 MG Commonly known as: ENTRESTO Take 1 tablet by mouth 2 (two) times daily. Replaces: sacubitril-valsartan 24-26 MG   tamsulosin 0.4 MG Caps capsule Commonly known as: FLOMAX Take 0.4 mg by mouth daily.      Follow-up Information    Crist Fat, MD Follow up in 1 week(s).   Specialty: Internal Medicine Why: cbc at that time to assess your blood counts Contact information: [address] [place] [address] [phone]        Baldo Daub, MD .   Specialty: Cardiology Contact  information: [address] [place] [address] [phone]            Time coordinating discharge: 35 min  Signed:  Joseph Art DO  [facility] 06/03/2019, 8:57 AM

## 2019-06-03 NOTE — Progress Notes (Signed)
Primary Cardiologist:  Bettina Gavia and Croitoru  Subjective:  Dyspnea BP better this am   Objective:  Vitals:   06/02/19 0900 06/02/19 1808 06/02/19 1837 06/02/19 2114  BP: (!) 146/51 (!) 172/79 (!) 142/66 (!) 135/56  Pulse:  91  90  Resp:  16  18  Temp:  98.5 F (36.9 C)  98.2 F (36.8 C)  TempSrc:  Oral  Oral  SpO2:  98%  100%  Weight:      Height:        Intake/Output from previous day:  Intake/Output Summary (Last 24 hours) at 06/03/2019 0802 Last data filed at 06/02/2019 1920 Gross per 24 hour  Intake 240 ml  Output 600 ml  Net -360 ml    Physical Exam: Affect appropriate Elderly thin male  HEENT: normal Neck supple with no adenopathy JVP normal no bruits no thyromegaly Lungs clear with no wheezing and good diaphragmatic motion Heart:  S1/S2 no murmur, no rub, gallop or click PMI normal Abdomen: benighn, BS positve, no tenderness, no AAA no bruit.  No HSM or HJR Distal pulses intact with no bruits No edema Neuro non-focal Skin warm and dry No muscular weakness   Lab Results: Basic Metabolic Panel: Recent Labs    06/02/19 0114 06/03/19 0358  NA 135 135  K 3.4* 3.5  CL 101 99  CO2 22 26  GLUCOSE 142* 56*  BUN 41* 37*  CREATININE 1.25* 1.23  CALCIUM 8.4* 8.3*   Liver Function Tests: Recent Labs    06/02/19 0114 06/03/19 0358  AST 20 17  ALT 14 14  ALKPHOS 57 54  BILITOT 1.0 1.1  PROT 6.0* 5.5*  ALBUMIN 3.2* 2.8*   No results for input(s): LIPASE, AMYLASE in the last 72 hours. CBC: Recent Labs    06/02/19 0114 06/03/19 0358  WBC 9.1 8.1  NEUTROABS 6.9 5.7  HGB 9.4* 9.1*  HCT 28.0* 28.0*  MCV 72.4* 74.7*  PLT 206 224   Hemoglobin A1C: Recent Labs    05/31/19 2140  HGBA1C 6.1*    Imaging: DG Chest 2 View  Result Date: 06/01/2019 CLINICAL DATA:  Pulmonary embolism and bilateral pleural effusions by prior CTA on 05/30/2019 EXAM: CHEST - 2 VIEW COMPARISON:  CTA of the chest and chest x-ray on 05/30/2019 at [place]: The heart size and mediastinal contours are within normal limits. No significant pleural effusions identified with tiny bilateral posterior effusions seen only on the lateral view. Stable chronic lung disease and mild hyperinflation without evidence of focal consolidation, pulmonary edema or pneumothorax. The visualized skeletal structures are unremarkable. IMPRESSION: Tiny bilateral posterior pleural effusions seen only on the lateral view. Stable chronic lung disease. Electronically Signed   By: Aletta Edouard M.D.   On: 06/01/2019 11:39   VAS [place] LOWER EXTREMITY VENOUS (DVT)  Result Date: 06/01/2019  Lower Venous Study Indications: Pulmonary embolism.  Limitations: Body habitus and restricted mobility. Comparison Study: No prior study. Performing Technologist: Maudry Mayhew MHA, RDMS, RVT, RDCS  Examination Guidelines: A complete evaluation includes B-mode imaging, spectral Doppler, color Doppler, and power Doppler as needed of all accessible portions of each vessel. Bilateral testing is considered an integral part of a complete examination. Limited examinations for reoccurring indications may be performed as noted.  +---------+---------------+---------+-----------+----------+--------------+ RIGHT    CompressibilityPhasicitySpontaneityPropertiesThrombus Aging +---------+---------------+---------+-----------+----------+--------------+ CFV      Full           Yes      Yes                                 +---------+---------------+---------+-----------+----------+--------------+  SFJ      Full                                                        +---------+---------------+---------+-----------+----------+--------------+ FV Prox  Full                                                        +---------+---------------+---------+-----------+----------+--------------+ FV Mid   Full                                                         +---------+---------------+---------+-----------+----------+--------------+ FV DistalFull                                                        +---------+---------------+---------+-----------+----------+--------------+ PFV      Full                                                        +---------+---------------+---------+-----------+----------+--------------+ POP      Full           Yes      Yes                                 +---------+---------------+---------+-----------+----------+--------------+ PTV      Full                                                        +---------+---------------+---------+-----------+----------+--------------+ PERO     Full                                                        +---------+---------------+---------+-----------+----------+--------------+   +---------+---------------+---------+-----------+----------+--------------+ LEFT     CompressibilityPhasicitySpontaneityPropertiesThrombus Aging +---------+---------------+---------+-----------+----------+--------------+ CFV      Full           Yes      Yes                                 +---------+---------------+---------+-----------+----------+--------------+ SFJ      Full                                                        +---------+---------------+---------+-----------+----------+--------------+  FV Prox  Full                                                        +---------+---------------+---------+-----------+----------+--------------+ FV Mid   Full                                                        +---------+---------------+---------+-----------+----------+--------------+ FV DistalFull                                                        +---------+---------------+---------+-----------+----------+--------------+ PFV      Full                                                         +---------+---------------+---------+-----------+----------+--------------+ POP      Full           Yes      Yes                                 +---------+---------------+---------+-----------+----------+--------------+ PTV      Full                                                        +---------+---------------+---------+-----------+----------+--------------+ PERO     Full                                                        +---------+---------------+---------+-----------+----------+--------------+  Summary: Right: There is no evidence of deep vein thrombosis in the lower extremity. No cystic structure found in the popliteal fossa. Left: There is no evidence of deep vein thrombosis in the lower extremity. No cystic structure found in the popliteal fossa.  *See table(s) above for measurements and observations. Electronically signed by Sherald Hesshristopher Clark MD on 06/01/2019 at 1:48:42 PM.    Final     Cardiac Studies:  ECG: SR anterolateral T wave changes    Telemetry:  NSR 06/03/2019   Echo: EF 35-40% 03/2919   Medications:   . apixaban  10 mg Oral BID   Followed by  . [START ON 06/09/2019] apixaban  5 mg Oral BID  . ARIPiprazole  5 mg Oral Daily  . atorvastatin  40 mg Oral q1800  . feeding supplement (ENSURE ENLIVE)  237 mL Oral BID BM  . ferrous sulfate  325 mg Oral BID WC  . furosemide  20 mg Oral Daily  . glipiZIDE  10 mg Oral BID AC  .  insulin aspart  0-15 Units Subcutaneous TID WC  . insulin aspart  0-5 Units Subcutaneous QHS  . metoprolol succinate  25 mg Oral Daily  . pantoprazole  40 mg Oral Daily  . sacubitril-valsartan  1 tablet Oral BID  . tamsulosin  0.4 mg Oral Daily  . venlafaxine XR  75 mg Oral Daily       Assessment/Plan:   1. PE:  Per primary service change to oral xarelto ? Today No evidence of LE DVT 2. CHF:  Chronic systolic BP better on higher dose Entresto not volume overloated continue lopressor and current lasix dose He refuses invasive  evaluation with undoubtedly ischemic DCM Worrisome anterolateral T wave changes suggesting LAD disease. Palliative to see patient depressed DNR 3. COPD:  1 ppd smoker since teens will likely need to go home on oxygen CXR yesterday with tiny posterior pleural effusions and chronic lung disease   Charlton Haws 06/03/2019, 8:02 AM

## 2019-06-03 NOTE — Progress Notes (Signed)
Palliative:  I have reviewed chart for new palliative consult and noted plans for discharge today. Discussed with Dr. Eliseo Squires and [facility] recommendation for outpatient palliative referral to aide in goals of care discussions with patient and family present.   No charge  Vinie Sill, NP Palliative Medicine Team Pager [phone] (Please see [email or web address] for schedule) Team Phone [phone]

## 2019-06-03 NOTE — Telephone Encounter (Signed)
Patient wife informed of results. 

## 2019-06-09 DIAGNOSIS — R262 Difficulty in walking, not elsewhere classified: Secondary | ICD-10-CM | POA: Diagnosis not present

## 2019-06-09 DIAGNOSIS — I5022 Chronic systolic (congestive) heart failure: Secondary | ICD-10-CM | POA: Diagnosis not present

## 2019-06-09 DIAGNOSIS — I2699 Other pulmonary embolism without acute cor pulmonale: Secondary | ICD-10-CM | POA: Diagnosis not present

## 2019-06-11 LAB — GLUCOSE, CAPILLARY
Glucose-Capillary: 230 mg/dL — ABNORMAL HIGH (ref 70–99)
Glucose-Capillary: 134 mg/dL — ABNORMAL HIGH (ref 70–99)

## 2019-06-13 DIAGNOSIS — I110 Hypertensive heart disease with heart failure: Secondary | ICD-10-CM | POA: Diagnosis not present

## 2019-06-13 DIAGNOSIS — Z7984 Long term (current) use of oral hypoglycemic drugs: Secondary | ICD-10-CM | POA: Diagnosis not present

## 2019-06-13 DIAGNOSIS — E119 Type 2 diabetes mellitus without complications: Secondary | ICD-10-CM | POA: Diagnosis not present

## 2019-06-13 DIAGNOSIS — K5730 Diverticulosis of large intestine without perforation or abscess without bleeding: Secondary | ICD-10-CM | POA: Diagnosis not present

## 2019-06-13 DIAGNOSIS — R262 Difficulty in walking, not elsewhere classified: Secondary | ICD-10-CM | POA: Diagnosis not present

## 2019-06-13 DIAGNOSIS — Z7902 Long term (current) use of antithrombotics/antiplatelets: Secondary | ICD-10-CM | POA: Diagnosis not present

## 2019-06-13 DIAGNOSIS — Z79899 Other long term (current) drug therapy: Secondary | ICD-10-CM | POA: Diagnosis not present

## 2019-06-13 DIAGNOSIS — I2699 Other pulmonary embolism without acute cor pulmonale: Secondary | ICD-10-CM | POA: Diagnosis not present

## 2019-06-13 DIAGNOSIS — Z87891 Personal history of nicotine dependence: Secondary | ICD-10-CM | POA: Diagnosis not present

## 2019-06-13 DIAGNOSIS — D509 Iron deficiency anemia, unspecified: Secondary | ICD-10-CM | POA: Diagnosis not present

## 2019-06-13 DIAGNOSIS — I5022 Chronic systolic (congestive) heart failure: Secondary | ICD-10-CM | POA: Diagnosis not present

## 2019-06-13 DIAGNOSIS — E785 Hyperlipidemia, unspecified: Secondary | ICD-10-CM | POA: Diagnosis not present

## 2019-06-19 DIAGNOSIS — E1165 Type 2 diabetes mellitus with hyperglycemia: Secondary | ICD-10-CM | POA: Diagnosis not present

## 2019-06-21 NOTE — Progress Notes (Signed)
Cardiology Office Note:    Date:  06/24/2019   ID:  Dominic Barber, DOB Sep 24, 1945, MRN [id number]  PCP:  Townsend Roger, MD  Cardiologist:  Shirlee More, MD    Referring MD: Nona Dell, Corene Cornea, MD   Patient walked out of the office without being seen after being roomed.   Medication Adjustments/Labs and Tests Ordered: Current medicines are reviewed at length with the patient today.  Concerns regarding medicines are outlined above.  No orders of the defined types were placed in this encounter.  No orders of the defined types were placed in this encounter.   Chief Complaint  Patient presents with  . Follow-up    History of Present Illness:    Dominic Barber is a 74 y.o. male with a hx of  heart failure severely reduced ejection fraction 35 to 40%.  His EKG showed sinus rhythm age-indeterminate anteroseptal myocardial infarction with ischemic T wave inversion.  He was last seen 05/06/2019. On 2 separate occasions he has refused hospitalization with what was felt to be acute coronary syndrome resulting in heart failure.  Most recent 04/09/2019.  Troponin drawn in my office was elevated troponin I 0.05, serum sodium was significantly depleted at 128 potassium 5.0 creatinine 1.09 and his proBNP level was extremely elevated greater than 31,000.  In response he is directed to the emergency room med [facility] for evaluation of ACS and hospitalization.  He was in decompensated heart failure advised admission right and left heart catheterization declined and left the hospital transitioning to ARB so that we could initiate Entresto as outpatient. He is subsequently admitted [facility] 05/30/2089 discharged 06/02/2089 with pulmonary embolism and decompensated heart failure.  He was COVID-19 negative CTA revealed acute segmental pulmonary emboli and concern for right heart strain and elevated troponin.  He also had moderate bilateral pleural effusions.  There is documentation on  discharge summary the repeat echocardiogram showed an EF of less than 20% however there is no formal report available in epic.  Lower extremity venous duplex imaging showed no findings of deep vein thrombosis.  He was anticoagulated heart failure treatment was optimized and was discharged with a palliative care consult.   Echo 04/03/2019;  1. Left ventricular ejection fraction, by visual estimation, is 35 to 40%. The left ventricle has moderate to severely decreased function. Left ventricular septal wall thickness was moderately increased. Moderately increased left ventricular posterior  wall thickness. There is moderately increased left ventricular hypertrophy.  2. Left ventricular diastolic parameters are consistent with Grade I diastolic dysfunction (impaired relaxation).  3. The left ventricle demonstrates global hypokinesis.  4. Global right ventricle has normal systolic function.The right ventricular size is normal. No increase in right ventricular wall thickness.  5. Left atrial size was normal.  6. Right atrial size was normal.  7. The mitral valve is normal in structure. No evidence of mitral valve regurgitation. No evidence of mitral stenosis.  8. The tricuspid valve is normal in structure. Tricuspid valve regurgitation is not demonstrated.  9. The aortic valve is tricuspid. Aortic valve regurgitation is not visualized. No evidence of aortic valve sclerosis or stenosis. 10. The pulmonic valve was normal in structure. Pulmonic valve regurgitation is not visualized. 11. The inferior vena cava is normal in size with greater than 50% respiratory variability, suggesting right atrial pressure of 3 mmHg.  last seen . Compliance with diet, lifestyle and medications: ** Past Medical History:  Diagnosis Date  . Anxiety   . BPH (benign prostatic  hyperplasia)   . Depression   . Eczema   . Hyperlipidemia   . Hypertensive heart disease   . Syncope   . Type 2 diabetes mellitus (HCC)     Past  Surgical History:  Procedure Laterality Date  . COLON SURGERY    . HERNIA REPAIR Bilateral   . SHOULDER SURGERY      Current Medications: Current Meds  Medication Sig  . ALPRAZolam (XANAX) 0.5 MG tablet Take 0.5 mg by mouth daily as needed.   Marland Kitchen apixaban (ELIQUIS) 5 MG TABS tablet Take 5 mg by mouth 2 (two) times daily.  . ARIPiprazole (ABILIFY) 5 MG tablet Take 1 tablet (5 mg total) by mouth daily.  Marland Kitchen atorvastatin (LIPITOR) 40 MG tablet Take 40 mg by mouth daily.   Marland Kitchen ENTRESTO 24-26 MG Take 1 tablet by mouth 2 (two) times daily.  . ferrous sulfate 325 (65 FE) MG tablet Take 1 tablet (325 mg total) by mouth 2 (two) times daily with a meal.  . furosemide (LASIX) 20 MG tablet Take 20 mg by mouth daily.  Marland Kitchen glipiZIDE (GLUCOTROL) 10 MG tablet Take 10 mg by mouth 2 (two) times daily before a meal.   . HYDROcodone-acetaminophen (NORCO/VICODIN) 5-325 MG tablet Take 1 tablet by mouth every 4 (four) hours as needed.  . metFORMIN (GLUCOPHAGE) 1000 MG tablet Take 1,000 mg by mouth 2 (two) times daily.   . metoprolol succinate (TOPROL-XL) 25 MG 24 hr tablet Take 1 tablet (25 mg total) by mouth daily.  . nitroGLYCERIN (NITROSTAT) 0.4 MG SL tablet Place 1 tablet (0.4 mg total) under the tongue every 5 (five) minutes as needed for chest pain. If you take more than 1 tablet you need to come to the emergency department.  . pantoprazole (PROTONIX) 40 MG tablet Take 40 mg by mouth daily.  . tamsulosin (FLOMAX) 0.4 MG CAPS capsule Take 0.4 mg by mouth daily.   Marland Kitchen venlafaxine XR (EFFEXOR XR) 75 MG 24 hr capsule Take 75 mg by mouth daily.      Allergies:   Meperidine   Social History   Socioeconomic History  . Marital status: Married    Spouse name: Not on file  . Number of children: Not on file  . Years of education: Not on file  . Highest education level: Not on file  Occupational History  . Not on file  Tobacco Use  . Smoking status: Current Every Day Smoker    Packs/day: 1.00    Years: 50.00     Pack years: 50.00    Types: Cigarettes  . Smokeless tobacco: Never Used  Substance and Sexual Activity  . Alcohol use: Never    Alcohol/week: 0.0 standard drinks  . Drug use: Never  . Sexual activity: Not on file  Other Topics Concern  . Not on file  Social History Narrative  . Not on file   Social Determinants of Health   Financial Resource Strain:   . Difficulty of Paying Living Expenses: Not on file  Food Insecurity:   . Worried About Programme researcher, broadcasting/film/video in the Last Year: Not on file  . Ran Out of Food in the Last Year: Not on file  Transportation Needs:   . Lack of Transportation (Medical): Not on file  . Lack of Transportation (Non-Medical): Not on file  Physical Activity:   . Days of Exercise per Week: Not on file  . Minutes of Exercise per Session: Not on file  Stress:   . Feeling of  Stress : Not on file  Social Connections:   . Frequency of Communication with Friends and Family: Not on file  . Frequency of Social Gatherings with Friends and Family: Not on file  . Attends Religious Services: Not on file  . Active Member of Clubs or Organizations: Not on file  . Attends Banker Meetings: Not on file  . Marital Status: Not on file     Family History: The patient's family history is negative for Heart attack, Stroke, and Diabetes. ROS:   Please see the history of present illness.    All other systems reviewed and are negative.  EKGs/Labs/Other Studies Reviewed:    The following studies were reviewed today:  EKG:  EKG 27 2020 personally reviewed sinus rhythm anterior MI QT is prolonged ischemic T wave inversion is present. X-ray 06/01/2019 showed improvement in pleural effusions and stable chronic lung disease. Recent Labs: 04/08/2019: NT-Pro BNP 31,084 05/31/2019: B Natriuretic Peptide >4,500.0 06/03/2019: ALT 14; BUN 37; Creatinine, Ser 1.23; Hemoglobin 9.1; Platelets 224; Potassium 3.5; Sodium 135  Recent Lipid Panel No results found for: CHOL,  TRIG, HDL, CHOLHDL, VLDL, LDLCALC, LDLDIRECT  Physical Exam:    VS:  BP 124/70 (BP Location: Right Arm, Patient Position: Sitting, Cuff Size: Normal)   Pulse 80   Ht 5\' 10"  (1.778 m)   Wt 135 lb (61.2 kg)   BMI 19.37 kg/m        Signed, , MD  06/24/2019 10:58 AM    [facility]

## 2019-06-23 ENCOUNTER — Encounter: Payer: Self-pay | Admitting: Cardiology

## 2019-06-23 ENCOUNTER — Other Ambulatory Visit: Payer: Self-pay

## 2019-06-23 ENCOUNTER — Ambulatory Visit (INDEPENDENT_AMBULATORY_CARE_PROVIDER_SITE_OTHER): Payer: PPO | Admitting: Cardiology

## 2019-06-23 VITALS — BP 124/70 | HR 80 | Ht 70.0 in | Wt 135.0 lb

## 2019-06-23 DIAGNOSIS — Z5329 Procedure and treatment not carried out because of patient's decision for other reasons: Secondary | ICD-10-CM

## 2019-07-03 ENCOUNTER — Telehealth: Payer: Self-pay | Admitting: Cardiology

## 2019-07-03 MED ORDER — APIXABAN 5 MG PO TABS: 5.0000 mg | ORAL_TABLET | Freq: Two times a day (BID) | ORAL | 0 refills | Status: AC

## 2019-07-03 NOTE — Telephone Encounter (Signed)
Pt c/o medication issue:  1. Name of Medication: apixaban (ELIQUIS) 5 MG TABS tablet  2. How are you currently taking this medication (dosage and times per day)? n/a  3. Are you having a reaction (difficulty breathing--STAT)? no  4. What is your medication issue? Patient was treated by Dr. Dulce Sellar in the hospital. Patient was given a starter pack of eliquis. Patient's wife is unsure if the patient needs a refill on this medication or if it was a medication he had to take at the hospital then stop.

## 2019-07-03 NOTE — Addendum Note (Signed)
Addended by: Sigurd Sos on: 07/03/2019 11:19 AM   Modules accepted: Orders

## 2019-07-03 NOTE — Telephone Encounter (Signed)
I spoke to the patient with Dr [facility] recommendation.  We will give the patient a 30 day supply of Eliquis and then the patient will f/u with PCP to continue managing.

## 2019-07-03 NOTE — Telephone Encounter (Signed)
This is a very high risk case  Walked out of my office last appointment without seeing me.  He was initiated on anticoagulation when as inpatient at [facility]  Think he should remain on Eliquis we can give him a prescription for 1 month and from this point on it should be managed by his primary care physician and I have no arrangements to see him in the office in follow-up.

## 2019-07-13 DIAGNOSIS — I5022 Chronic systolic (congestive) heart failure: Secondary | ICD-10-CM | POA: Diagnosis not present

## 2019-07-13 DIAGNOSIS — Z87891 Personal history of nicotine dependence: Secondary | ICD-10-CM | POA: Diagnosis not present

## 2019-07-13 DIAGNOSIS — Z7902 Long term (current) use of antithrombotics/antiplatelets: Secondary | ICD-10-CM | POA: Diagnosis not present

## 2019-07-13 DIAGNOSIS — D509 Iron deficiency anemia, unspecified: Secondary | ICD-10-CM | POA: Diagnosis not present

## 2019-07-13 DIAGNOSIS — R262 Difficulty in walking, not elsewhere classified: Secondary | ICD-10-CM | POA: Diagnosis not present

## 2019-07-13 DIAGNOSIS — I110 Hypertensive heart disease with heart failure: Secondary | ICD-10-CM | POA: Diagnosis not present

## 2019-07-13 DIAGNOSIS — Z7984 Long term (current) use of oral hypoglycemic drugs: Secondary | ICD-10-CM | POA: Diagnosis not present

## 2019-07-13 DIAGNOSIS — Z79899 Other long term (current) drug therapy: Secondary | ICD-10-CM | POA: Diagnosis not present

## 2019-07-13 DIAGNOSIS — E785 Hyperlipidemia, unspecified: Secondary | ICD-10-CM | POA: Diagnosis not present

## 2019-07-13 DIAGNOSIS — I2699 Other pulmonary embolism without acute cor pulmonale: Secondary | ICD-10-CM | POA: Diagnosis not present

## 2019-07-13 DIAGNOSIS — K5730 Diverticulosis of large intestine without perforation or abscess without bleeding: Secondary | ICD-10-CM | POA: Diagnosis not present

## 2019-07-13 DIAGNOSIS — E119 Type 2 diabetes mellitus without complications: Secondary | ICD-10-CM | POA: Diagnosis not present

## 2019-07-14 DIAGNOSIS — I951 Orthostatic hypotension: Secondary | ICD-10-CM | POA: Diagnosis not present

## 2019-07-14 DIAGNOSIS — F331 Major depressive disorder, recurrent, moderate: Secondary | ICD-10-CM | POA: Diagnosis not present

## 2019-07-14 DIAGNOSIS — E871 Hypo-osmolality and hyponatremia: Secondary | ICD-10-CM | POA: Diagnosis not present

## 2019-07-14 DIAGNOSIS — G2581 Restless legs syndrome: Secondary | ICD-10-CM | POA: Diagnosis not present

## 2019-07-14 DIAGNOSIS — I5042 Chronic combined systolic (congestive) and diastolic (congestive) heart failure: Secondary | ICD-10-CM | POA: Diagnosis not present

## 2019-07-30 DIAGNOSIS — G4700 Insomnia, unspecified: Secondary | ICD-10-CM | POA: Diagnosis not present

## 2019-07-30 DIAGNOSIS — I5022 Chronic systolic (congestive) heart failure: Secondary | ICD-10-CM | POA: Diagnosis not present

## 2019-07-30 DIAGNOSIS — G2581 Restless legs syndrome: Secondary | ICD-10-CM | POA: Diagnosis not present

## 2019-08-29 DIAGNOSIS — J449 Chronic obstructive pulmonary disease, unspecified: Secondary | ICD-10-CM | POA: Diagnosis not present

## 2019-08-29 DIAGNOSIS — D692 Other nonthrombocytopenic purpura: Secondary | ICD-10-CM | POA: Diagnosis not present

## 2019-08-29 DIAGNOSIS — F3341 Major depressive disorder, recurrent, in partial remission: Secondary | ICD-10-CM | POA: Diagnosis not present

## 2019-08-29 DIAGNOSIS — E1140 Type 2 diabetes mellitus with diabetic neuropathy, unspecified: Secondary | ICD-10-CM | POA: Diagnosis not present

## 2019-08-29 DIAGNOSIS — F17200 Nicotine dependence, unspecified, uncomplicated: Secondary | ICD-10-CM | POA: Diagnosis not present

## 2019-08-29 DIAGNOSIS — E441 Mild protein-calorie malnutrition: Secondary | ICD-10-CM | POA: Diagnosis not present

## 2019-08-29 DIAGNOSIS — E261 Secondary hyperaldosteronism: Secondary | ICD-10-CM | POA: Diagnosis not present

## 2019-08-29 DIAGNOSIS — I2782 Chronic pulmonary embolism: Secondary | ICD-10-CM | POA: Diagnosis not present

## 2019-08-29 DIAGNOSIS — I509 Heart failure, unspecified: Secondary | ICD-10-CM | POA: Diagnosis not present

## 2019-08-29 DIAGNOSIS — I110 Hypertensive heart disease with heart failure: Secondary | ICD-10-CM | POA: Diagnosis not present

## 2019-08-29 DIAGNOSIS — K219 Gastro-esophageal reflux disease without esophagitis: Secondary | ICD-10-CM | POA: Diagnosis not present

## 2019-08-29 DIAGNOSIS — D6859 Other primary thrombophilia: Secondary | ICD-10-CM | POA: Diagnosis not present

## 2019-09-24 DIAGNOSIS — G8194 Hemiplegia, unspecified affecting left nondominant side: Secondary | ICD-10-CM | POA: Diagnosis not present

## 2019-09-24 DIAGNOSIS — J439 Emphysema, unspecified: Secondary | ICD-10-CM | POA: Diagnosis not present

## 2019-09-24 DIAGNOSIS — W19XXXA Unspecified fall, initial encounter: Secondary | ICD-10-CM | POA: Diagnosis not present

## 2019-09-24 DIAGNOSIS — R29704 NIHSS score 4: Secondary | ICD-10-CM | POA: Diagnosis not present

## 2019-09-24 DIAGNOSIS — I252 Old myocardial infarction: Secondary | ICD-10-CM | POA: Diagnosis not present

## 2019-09-24 DIAGNOSIS — I6381 Other cerebral infarction due to occlusion or stenosis of small artery: Secondary | ICD-10-CM | POA: Diagnosis not present

## 2019-09-24 DIAGNOSIS — R531 Weakness: Secondary | ICD-10-CM | POA: Diagnosis not present

## 2019-09-24 DIAGNOSIS — Z7902 Long term (current) use of antithrombotics/antiplatelets: Secondary | ICD-10-CM | POA: Diagnosis not present

## 2019-09-24 DIAGNOSIS — R29810 Facial weakness: Secondary | ICD-10-CM | POA: Diagnosis not present

## 2019-09-24 DIAGNOSIS — R262 Difficulty in walking, not elsewhere classified: Secondary | ICD-10-CM | POA: Diagnosis not present

## 2019-09-24 DIAGNOSIS — I10 Essential (primary) hypertension: Secondary | ICD-10-CM | POA: Diagnosis not present

## 2019-09-24 DIAGNOSIS — R4781 Slurred speech: Secondary | ICD-10-CM | POA: Diagnosis not present

## 2019-09-24 DIAGNOSIS — E7800 Pure hypercholesterolemia, unspecified: Secondary | ICD-10-CM | POA: Diagnosis not present

## 2019-11-16 DIAGNOSIS — M545 Low back pain: Secondary | ICD-10-CM | POA: Diagnosis not present

## 2019-11-16 DIAGNOSIS — M546 Pain in thoracic spine: Secondary | ICD-10-CM | POA: Diagnosis not present

## 2019-11-16 DIAGNOSIS — W19XXXA Unspecified fall, initial encounter: Secondary | ICD-10-CM | POA: Diagnosis not present

## 2019-11-16 DIAGNOSIS — R531 Weakness: Secondary | ICD-10-CM | POA: Diagnosis not present

## 2020-01-04 DEATH — deceased

## 2020-02-28 IMAGING — CR DG CHEST 2V
2 series · 2 of 2 positions shown · non-contrast
Comparison: Chest x-rays dated 03/26/2019 and 08/02/2018

CLINICAL DATA: Dyspnea.

EXAM:
CHEST - 2 VIEW

[w chest pa]
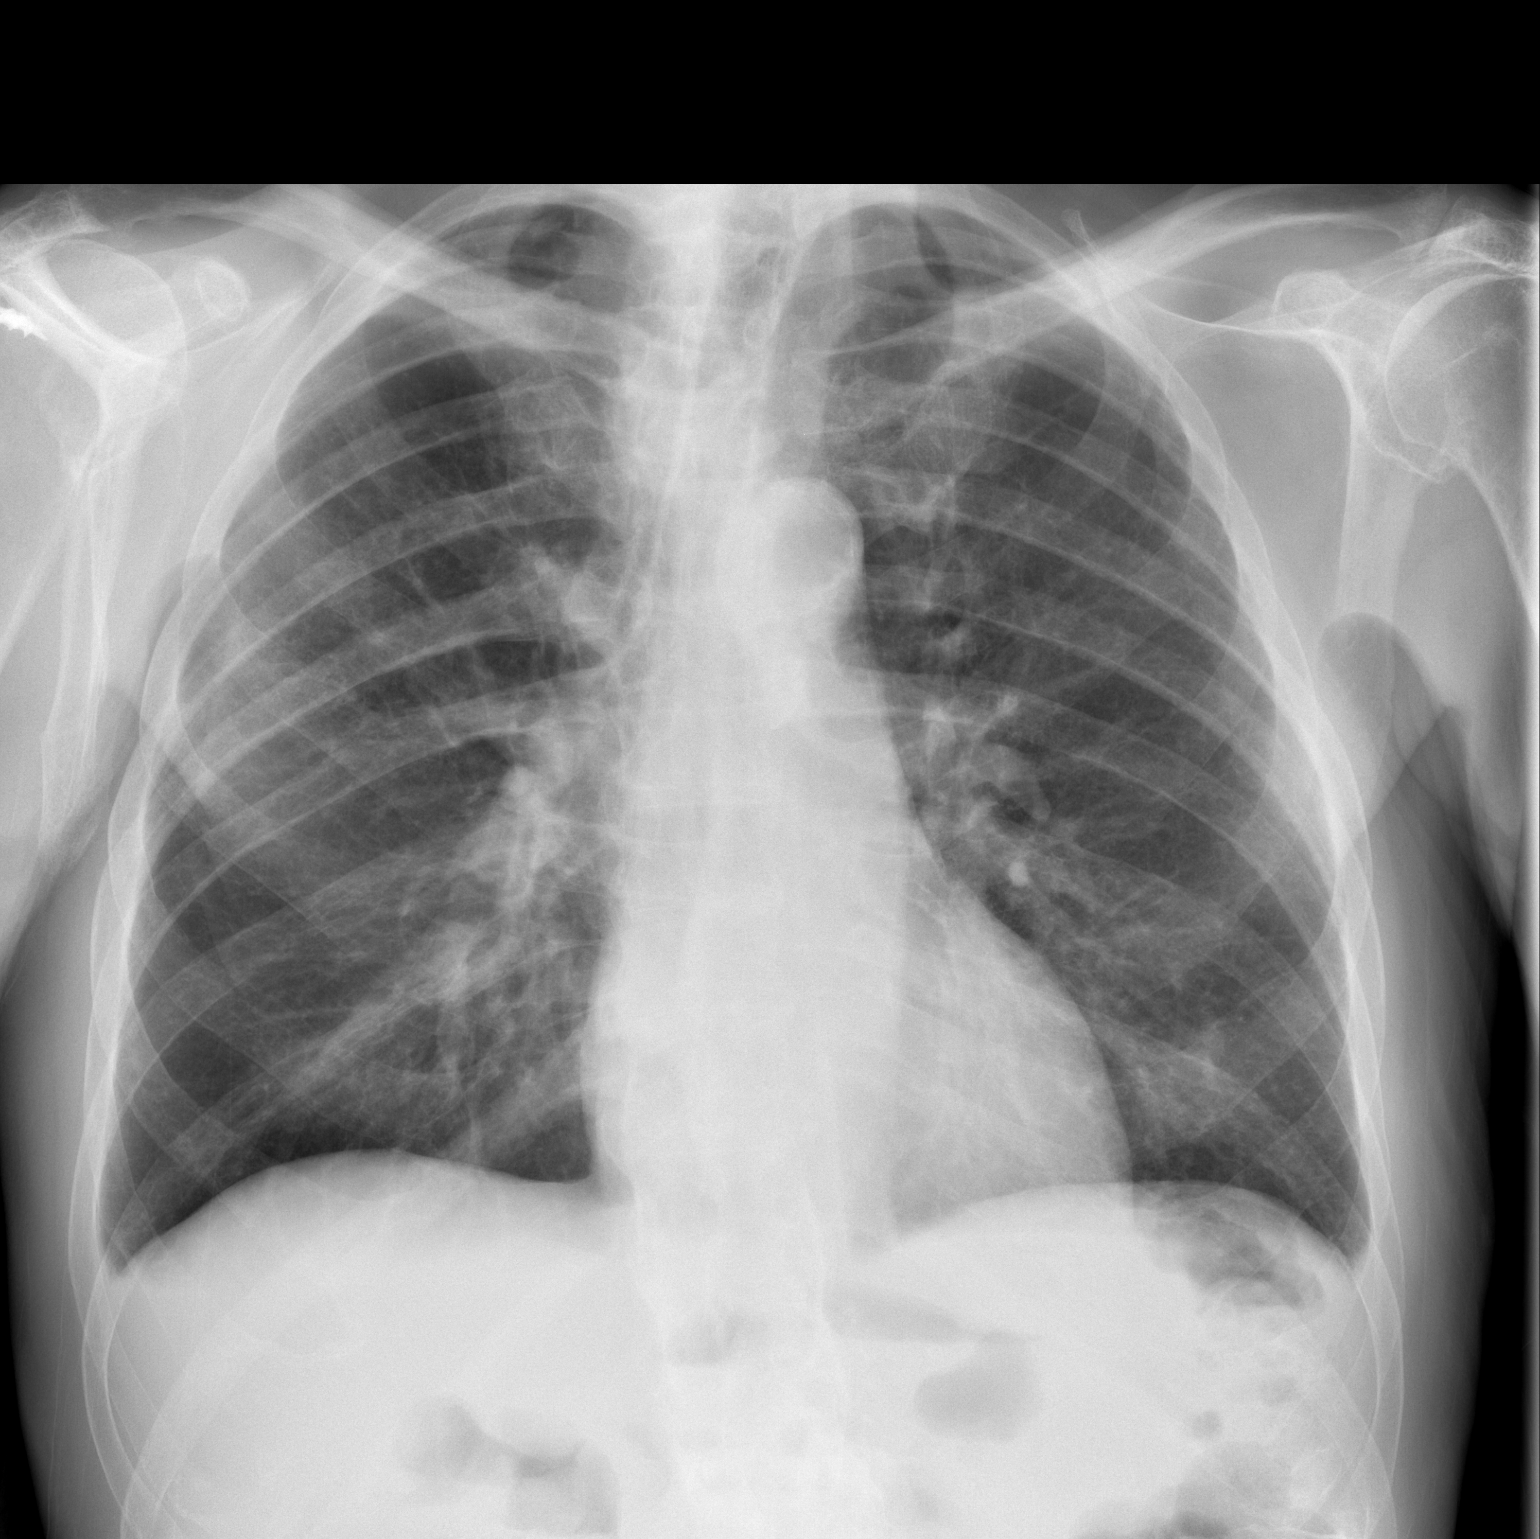

[w chest lat]
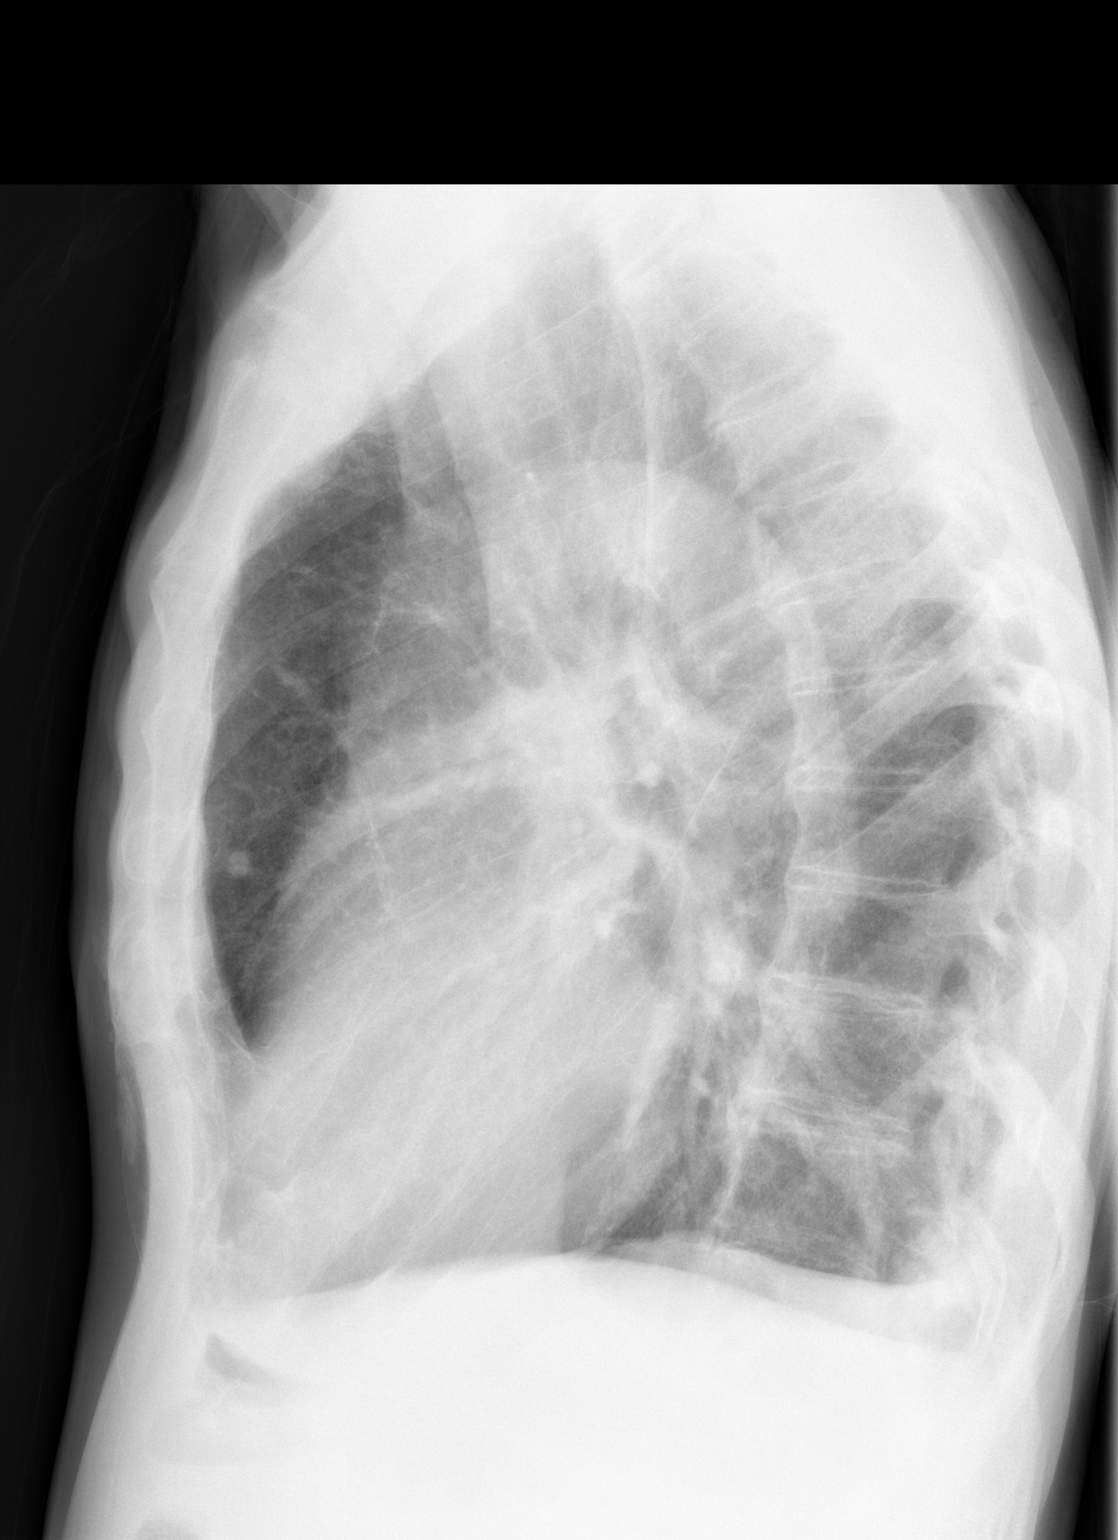

[2 of 2 positions shown; findings below may reference images not displayed]

FINDINGS: The heart size and pulmonary vascularity are normal. Aortic
atherosclerosis. There is a persistent area of faint haziness in the
right midzone laterally, unchanged. There is a dense well-defined 6
mm nodule seen anteriorly on the lateral which is not identified on
the PA view. This probably represents a granuloma. Lungs are
otherwise clear. Lungs are somewhat hyperinflated with flattening of
the diaphragm. No discrete effusions. No acute bone abnormality.
IMPRESSION: 1. No acute abnormalities.
2. Stable faint haziness in the right midzone.
3. 9 mm nodule on the lateral view as described. Likely a granuloma
based on the density and sharp definition of borders.
4. Aortic atherosclerosis.

## 2020-04-21 IMAGING — CR DG CHEST 2V
2 series · 2 of 2 positions shown · non-contrast
Comparison: CTA of the chest and chest x-ray on 05/30/2019 at
[facility]

CLINICAL DATA: Pulmonary embolism and bilateral pleural effusions
by prior CTA on 05/30/2019

EXAM:
CHEST - 2 VIEW

[chest lat]
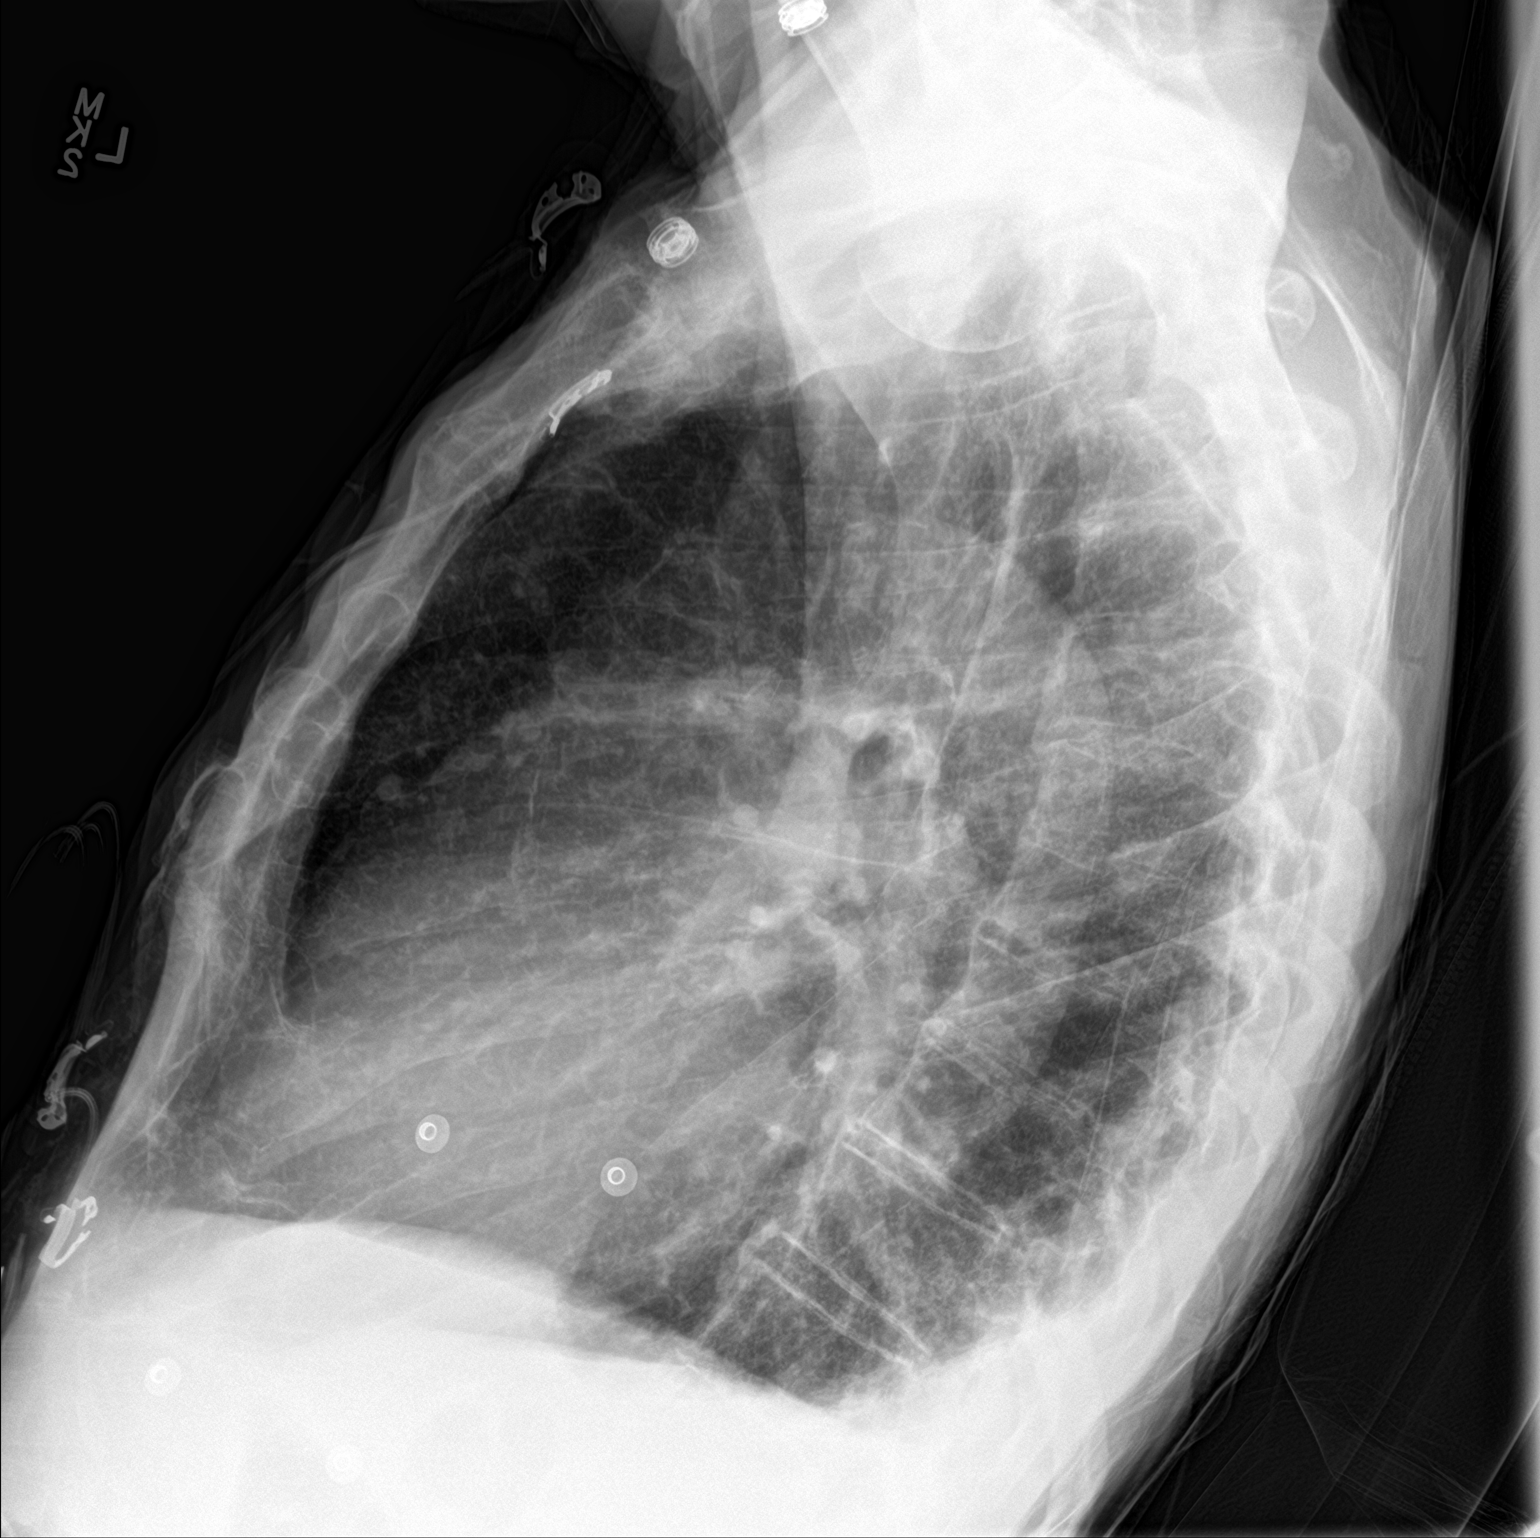

[chest ap]
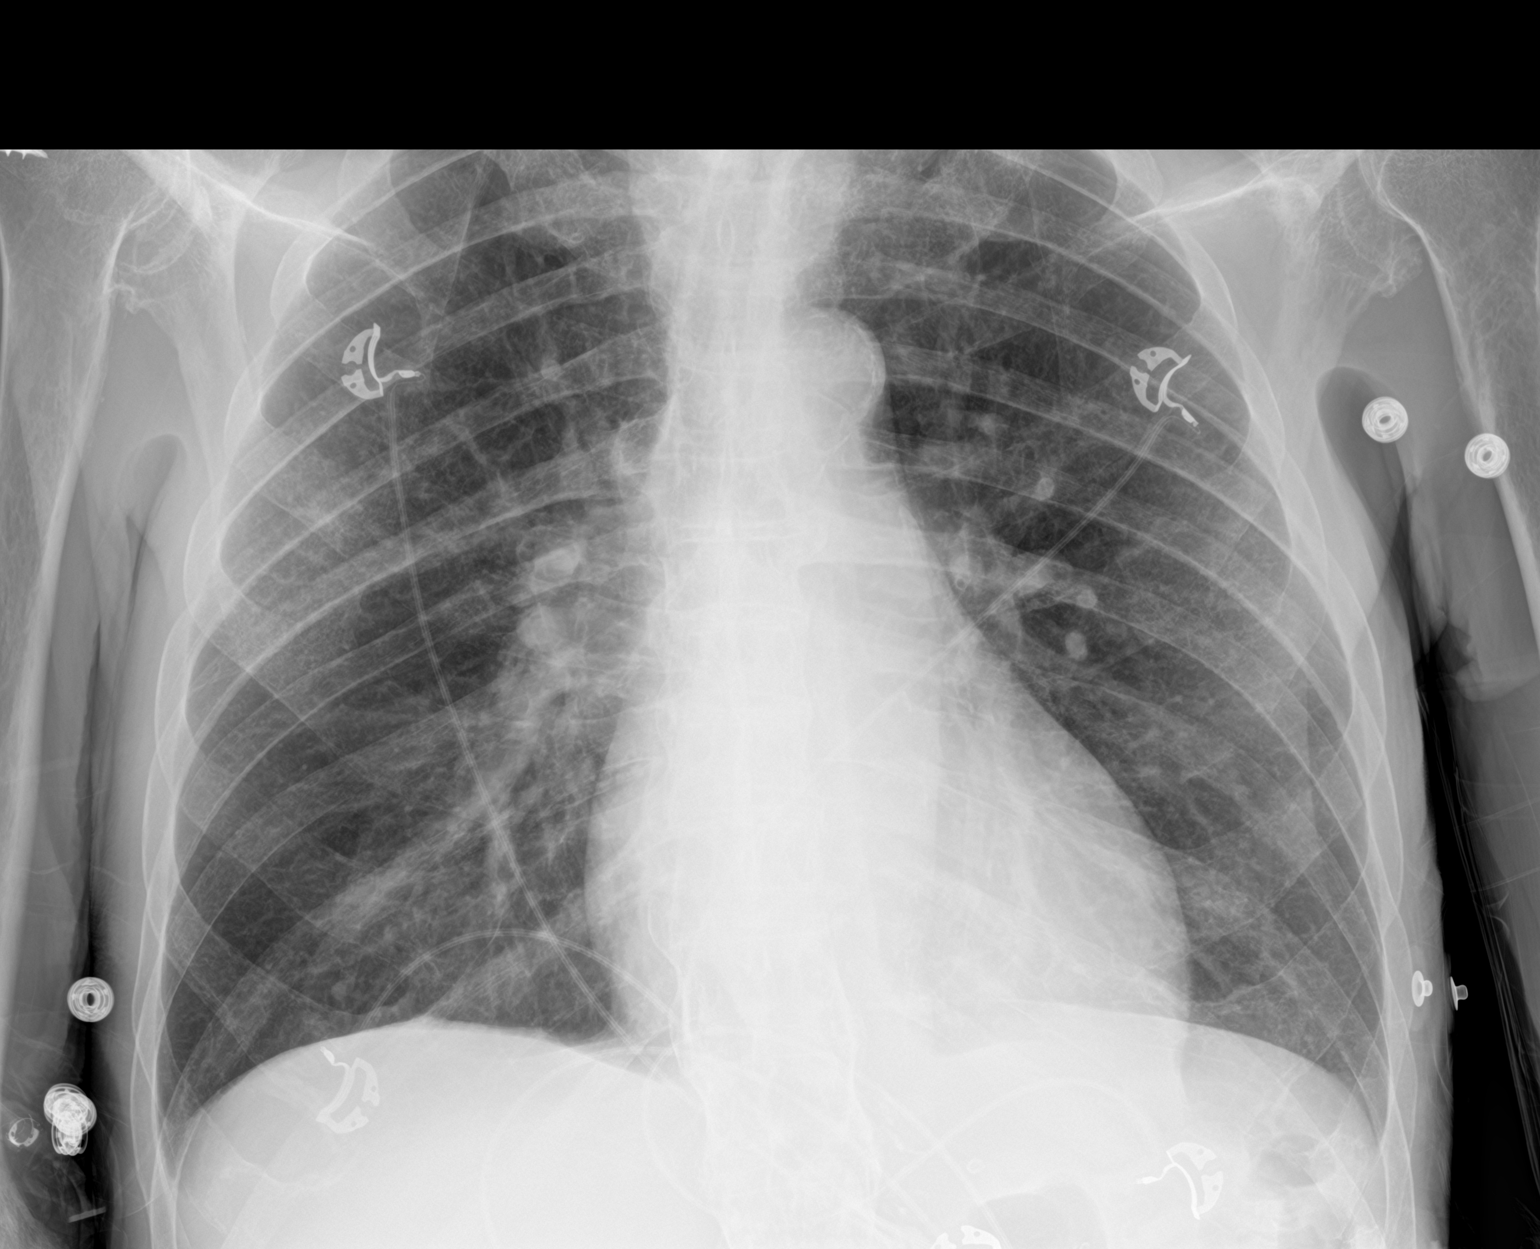

[2 of 2 positions shown; findings below may reference images not displayed]

FINDINGS: The heart size and mediastinal contours are within normal limits. No
significant pleural effusions identified with tiny bilateral
posterior effusions seen only on the lateral view. Stable chronic
lung disease and mild hyperinflation without evidence of focal
consolidation, pulmonary edema or pneumothorax. The visualized
skeletal structures are unremarkable.
IMPRESSION: Tiny bilateral posterior pleural effusions seen only on the lateral
view. Stable chronic lung disease.
